# Patient Record
Sex: Female | Born: 1995 | Race: Black or African American | Hispanic: No | Marital: Single | Smoking: Never smoker
Health system: Southern US, Community
[De-identification: ages and names within clinical notes are randomized; demographics above are authoritative.]

## PROBLEM LIST (undated history)

## (undated) DIAGNOSIS — J45909 Unspecified asthma, uncomplicated: Secondary | ICD-10-CM

---

## 2017-09-14 ENCOUNTER — Emergency Department (HOSPITAL_BASED_OUTPATIENT_CLINIC_OR_DEPARTMENT_OTHER)
Admission: EM | Admit: 2017-09-14 | Discharge: 2017-09-14 | Disposition: A | Discharge status: Home / Self Care | Payer: Self-pay | Attending: Emergency Medicine | Admitting: Emergency Medicine

## 2017-09-14 ENCOUNTER — Other Ambulatory Visit: Payer: Self-pay

## 2017-09-14 ENCOUNTER — Encounter (HOSPITAL_BASED_OUTPATIENT_CLINIC_OR_DEPARTMENT_OTHER): Payer: Self-pay | Admitting: Emergency Medicine

## 2017-09-14 ENCOUNTER — Emergency Department (HOSPITAL_BASED_OUTPATIENT_CLINIC_OR_DEPARTMENT_OTHER): Payer: Self-pay

## 2017-09-14 DIAGNOSIS — Y929 Unspecified place or not applicable: Secondary | ICD-10-CM | POA: Insufficient documentation

## 2017-09-14 DIAGNOSIS — S63502A Unspecified sprain of left wrist, initial encounter: Secondary | ICD-10-CM | POA: Insufficient documentation

## 2017-09-14 DIAGNOSIS — Y999 Unspecified external cause status: Secondary | ICD-10-CM | POA: Insufficient documentation

## 2017-09-14 DIAGNOSIS — F17200 Nicotine dependence, unspecified, uncomplicated: Secondary | ICD-10-CM | POA: Insufficient documentation

## 2017-09-14 DIAGNOSIS — Y9389 Activity, other specified: Secondary | ICD-10-CM | POA: Insufficient documentation

## 2017-09-14 MED ORDER — HYDROCODONE-ACETAMINOPHEN 5-325 MG PO TABS
2.0000 | ORAL_TABLET | Freq: Once | ORAL | Status: DC
  Filled 2017-09-14: qty 2

## 2017-09-14 MED ORDER — ONDANSETRON 4 MG PO TBDP
4.0000 mg | ORAL_TABLET | Freq: Once | ORAL | Status: AC
  Administered 2017-09-14: 4 mg via ORAL
  Filled 2017-09-14: qty 1
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
## 2017-09-14 NOTE — Discharge Instructions (Signed)
You can take Tylenol or Ibuprofen as directed for pain. You can alternate Tylenol and Ibuprofen every 4 hours. If you take Tylenol at 1pm, then you can take Ibuprofen at 5pm. Then you can take Tylenol again at 9pm.   Follow the RICE (Rest, Ice, Compression, Elevation) protocol as directed.   Wear the splint for support and stabilization.  As we discussed, if your symptoms do not improve in the next 1-2 weeks, he can follow-up with outpatient orthopedic doctor.  Return to the emergency department for any worsening pain, discoloration of the fingers, numbness/weakness of the hands, fevers, redness or swelling of the hands or any other worsening or concerning symptoms.
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
## 2017-11-19 ENCOUNTER — Encounter (HOSPITAL_BASED_OUTPATIENT_CLINIC_OR_DEPARTMENT_OTHER): Payer: Self-pay

## 2017-11-19 ENCOUNTER — Other Ambulatory Visit: Payer: Self-pay

## 2017-11-19 ENCOUNTER — Emergency Department (HOSPITAL_BASED_OUTPATIENT_CLINIC_OR_DEPARTMENT_OTHER)
Admission: EM | Admit: 2017-11-19 | Discharge: 2017-11-19 | Disposition: A | Discharge status: Home / Self Care | Payer: 59 | Attending: Emergency Medicine | Admitting: Emergency Medicine

## 2017-11-19 DIAGNOSIS — K0889 Other specified disorders of teeth and supporting structures: Secondary | ICD-10-CM | POA: Insufficient documentation

## 2017-11-19 MED ORDER — IBUPROFEN 100 MG/5ML PO SUSP
800.0000 mg | Freq: Once | ORAL | Status: AC
  Administered 2017-11-19: 800 mg via ORAL

## 2017-11-19 MED ORDER — IBUPROFEN 100 MG/5ML PO SUSP: 800.0000 mg | Freq: Once | ORAL | Status: DC

## 2017-11-19 MED ORDER — AMOXICILLIN 400 MG/5ML PO SUSR
500.0000 mg | Freq: Three times a day (TID) | ORAL | 0 refills | Status: AC
Start: 2017-11-19 — End: 2017-11-26

## 2017-11-19 MED ORDER — HYDROCODONE-ACETAMINOPHEN 7.5-325 MG/15ML PO SOLN: 10.0000 mL | Freq: Four times a day (QID) | ORAL | 0 refills | Status: DC | PRN

## 2017-11-19 MED ORDER — OXYCODONE-ACETAMINOPHEN 5-325 MG PO TABS
1.0000 | ORAL_TABLET | Freq: Once | ORAL | Status: AC
  Administered 2017-11-19: 1 via ORAL

## 2017-11-19 MED ORDER — OXYCODONE-ACETAMINOPHEN 5-325 MG PO TABS
ORAL_TABLET | ORAL | Status: AC
  Filled 2017-11-19: qty 1

## 2017-11-19 MED ORDER — AMOXICILLIN 400 MG/5ML PO SUSR: 500.0000 mg | Freq: Three times a day (TID) | ORAL | 0 refills | Status: DC

## 2017-11-19 MED ORDER — IBUPROFEN 100 MG/5ML PO SUSP
ORAL | Status: AC
  Filled 2017-11-19: qty 40

## 2017-11-19 MED ORDER — BUPIVACAINE-EPINEPHRINE (PF) 0.5% -1:200000 IJ SOLN
INTRAMUSCULAR | Status: AC
  Filled 2017-11-19: qty 1.8

## 2017-11-19 MED ORDER — OXYCODONE-ACETAMINOPHEN 5-325 MG PO TABS: 1.0000 | ORAL_TABLET | ORAL | 0 refills | Status: DC | PRN

## 2017-11-19 MED ORDER — BUPIVACAINE-EPINEPHRINE (PF) 0.5% -1:200000 IJ SOLN
1.8000 mL | Freq: Once | INTRAMUSCULAR | Status: AC
  Administered 2017-11-19: 1.8 mL

## 2017-11-19 NOTE — Discharge Instructions (Signed)
You were seen in the ED today with dental pain. We attempted a dental block and provided pain medication. Call your dentist first thing tomorrow morning. Take the medication as prescribed and do not drive a car while taking Percocet. If you have any left over pills after your pain is gone you should throw these pills away. Return to the ED with any difficulty breathing, fever, chills, or difficulty swallowing.

## 2017-11-19 NOTE — ED Notes (Signed)
ED Provider at bedside. 

## 2017-11-19 NOTE — ED Provider Notes (Signed)
Emergency Department Provider Note   I have reviewed the triage vital signs and the nursing notes.   HISTORY  Chief Complaint Dental Pain   HPI Maureen Raymond is a 22 y.o. female presents to the emergency department for evaluation of severe, right-sided dental pain.  She has braces and is followed by dentistry.  When she began having pain recently she was started on antibiotics.  She states that her dentist told her that the filling is close to the nerve in that tooth and that she may need a root canal.  She has been referred to an oral surgeon but is waiting for antibiotics to he completed.  Today she has had worsening pain without fever.  No appreciable swelling.  If occult he swallowing.  History reviewed. No pertinent past medical history.  There are no active problems to display for this patient.   History reviewed. No pertinent surgical history.  Current Outpatient Rx  . Order #: [id number] Class: Historical Med  . Order #: [id number] Class: Print  . Order #: [id number] Class: Print    Allergies Patient has no known allergies.  No family history on file.  Social History Social History   Tobacco Use  . Smoking status: Current Every Day Smoker  . Smokeless tobacco: Never Used  Substance Use Topics  . Alcohol use: Yes    Comment: occ  . Drug use: Never    Review of Systems  Constitutional: No fever/chills Eyes: No visual changes. ENT: No sore throat. Severe right sided dental pain.  Cardiovascular: Denies chest pain. Respiratory: Denies shortness of breath. Gastrointestinal: No abdominal pain.  No nausea, no vomiting.  No diarrhea.  No constipation. Genitourinary: Negative for dysuria. Musculoskeletal: Negative for back pain. Skin: Negative for rash. Neurological: Negative for headaches, focal weakness or numbness.  10-point ROS otherwise negative.  ____________________________________________   PHYSICAL EXAM:  VITAL SIGNS: ED Triage Vitals  Enc  Vitals Group     BP 11/19/17 2107 136/90     Pulse Rate 11/19/17 2107 89     Resp 11/19/17 2107 (!) 24     Temp 11/19/17 2107 98.6 F (37 C)     Temp Source 11/19/17 2107 Oral     SpO2 11/19/17 2107 100 %     Weight 11/19/17 2106 175 lb 0.7 oz (79.4 kg)     Height 11/19/17 2106 5\' 5"  (1.651 m)     Pain Score 11/19/17 2105 10   Constitutional: Alert and oriented. Patient is tearful and holding her right face.  Eyes: Conjunctivae are normal.  Head: Atraumatic. Nose: No congestion/rhinnorhea. Mouth/Throat: Mucous membranes are moist.  Oropharynx non-erythematous. Braces in place. No visible abscess or dental fracture on the left mandibular molars. No PTA. Managing oral secretions and speaking in a clear voice.  Neck: No stridor.   Cardiovascular:  Good peripheral circulation.  Respiratory: Normal respiratory effort.   Musculoskeletal: No gross deformities of extremities. Neurologic: No gross focal neurologic deficits are appreciated.  Skin:  Skin is warm, dry and intact. No rash noted.  ____________________________________________   PROCEDURES  Procedure(s) performed:   Dental Block Date/Time: 11/19/2017 9:32 PM Performed by: Maia Plan, MD Authorized by: Maia Plan, MD   Consent:    Consent obtained:  Verbal   Consent given by:  Patient   Risks discussed:  Allergic reaction, hematoma, intravascular injection, infection, nerve damage, pain, unsuccessful block and swelling   Alternatives discussed:  No treatment Indications:    Indications: dental pain   Location:  Block type:  Inferior alveolar   Laterality:  Right Procedure details (see MAR for exact dosages):    Syringe type:  Aspirating dental syringe   Needle gauge:  25 G   Anesthetic injected:  Bupivacaine 0.5% w/o epi   Injection procedure:  Anatomic landmarks identified, anatomic landmarks palpated, introduced needle, negative aspiration for blood and incremental injection Post-procedure details:     Outcome:  Anesthesia achieved   Patient tolerance of procedure:  Tolerated well, no immediate complications     ____________________________________________   INITIAL IMPRESSION / ASSESSMENT AND PLAN / ED COURSE  Pertinent labs & imaging results that were available during my care of the patient were reviewed by me and considered in my medical decision making (see chart for details).  Patient presents to the emergency department for evaluation of continued dental pain.  Patient is tearful on my exam and appears very uncomfortable.  No visible abscess.  I performed a dental block in the inferior alveolar region with marcaine as described above. Patient pain resolved. No immediate complications. Wrote for liquid Amox and pain medication. Patient to call dentist for urgent appt in the AM.   At this time, I do not feel there is any life-threatening condition present. I have reviewed and discussed all exam findings with patient. I have reviewed nursing notes and appropriate previous records.  I feel the patient is safe to be discharged home without further emergent workup. Discussed usual and customary return precautions. Patient and family (if present) verbalize understanding and are comfortable with this plan.  Patient will follow-up with their primary care provider. If they do not have a primary care provider, information for follow-up has been provided to them. All questions have been answered.  ____________________________________________  FINAL CLINICAL IMPRESSION(S) / ED DIAGNOSES  Final diagnoses:  Pain, dental     MEDICATIONS GIVEN DURING THIS VISIT:  Medications  bupivacaine-epinephrine (MARCAINE W/ EPI) 0.5% -1:200000 injection 1.8 mL (1.8 mLs Infiltration Given by Other 11/19/17 2112)  oxyCODONE-acetaminophen (PERCOCET/ROXICET) 5-325 MG per tablet 1 tablet (1 tablet Oral Given 11/19/17 2131)  ibuprofen (ADVIL,MOTRIN) 100 MG/5ML suspension 800 mg (800 mg Oral Given 11/19/17 2158)      NEW OUTPATIENT MEDICATIONS STARTED DURING THIS VISIT:  Discharge Medication List as of 11/19/2017 10:00 PM    START taking these medications   Details  amoxicillin (AMOXIL) 400 MG/5ML suspension Take 6.3 mLs (500 mg total) by mouth 3 (three) times daily for 7 days., Starting Wed 11/19/2017, Until Wed 11/26/2017, Print    oxyCODONE-acetaminophen (PERCOCET/ROXICET) 5-325 MG tablet Take 1 tablet by mouth every 4 (four) hours as needed for severe pain., Starting Wed 11/19/2017, Print        Note:  This document was prepared using Dragon voice recognition software and may include unintentional dictation errors.  Alona BeneJoshua Sunny Gains Zeringue, MD Emergency Medicine    Hattie Aguinaldo, Arlyss RepressJoshua G, MD 11/20/17 1125

## 2017-11-19 NOTE — ED Triage Notes (Signed)
C/o right lower toothache x "few months"-entered triage steady gait/crying

## 2018-07-31 ENCOUNTER — Encounter (HOSPITAL_BASED_OUTPATIENT_CLINIC_OR_DEPARTMENT_OTHER): Payer: Self-pay | Admitting: Emergency Medicine

## 2018-07-31 ENCOUNTER — Emergency Department (HOSPITAL_BASED_OUTPATIENT_CLINIC_OR_DEPARTMENT_OTHER)
Admission: EM | Admit: 2018-07-31 | Discharge: 2018-07-31 | Disposition: A | Discharge status: Home / Self Care | Payer: 59 | Attending: Emergency Medicine | Admitting: Emergency Medicine

## 2018-07-31 ENCOUNTER — Other Ambulatory Visit: Payer: Self-pay

## 2018-07-31 DIAGNOSIS — F17200 Nicotine dependence, unspecified, uncomplicated: Secondary | ICD-10-CM | POA: Insufficient documentation

## 2018-07-31 DIAGNOSIS — K0889 Other specified disorders of teeth and supporting structures: Secondary | ICD-10-CM

## 2018-07-31 MED ORDER — AMOXICILLIN 250 MG/5ML PO SUSR
500.0000 mg | Freq: Once | ORAL | Status: AC
  Administered 2018-07-31: 500 mg via ORAL

## 2018-07-31 MED ORDER — AMOXICILLIN 400 MG/5ML PO SUSR: 1000.0000 mg | Freq: Two times a day (BID) | ORAL | 0 refills | Status: DC

## 2018-07-31 MED ORDER — HYDROCODONE-ACETAMINOPHEN 7.5-325 MG/15ML PO SOLN: 10.0000 mL | Freq: Four times a day (QID) | ORAL | 0 refills | Status: DC | PRN

## 2018-07-31 MED ORDER — AMOXICILLIN 250 MG/5ML PO SUSR
1000.0000 mg | Freq: Once | ORAL | Status: DC
  Filled 2018-07-31: qty 20

## 2018-07-31 NOTE — Discharge Instructions (Signed)
Take amoxicillin twice daily for 7 days.   Take motrin for pain   Take hycet for severe pain.   You need to see your dentist on Monday for follow up   Return to ER if you have worse pain or swelling, fever, trouble swallowing

## 2018-07-31 NOTE — ED Provider Notes (Signed)
[facility] EMERGENCY DEPARTMENT Provider Note   CSN: [id number] Arrival date & time: 07/31/18  1745    History   Chief Complaint Chief Complaint  Patient presents with  . Dental Pain    HPI Maureen Raymond is a 23 y.o. female here with dental pain.  Patient has dental pain for the last month or so.  Patient actually has a bracelets but was unable to see her dentist for the last month.  Patient noticed progressive pain in the left lower molar area and some swelling in the left side of her neck.  She denies any trouble swallowing or trouble breathing.  Denies any fevers or chills or drainage.      The history is provided by the patient.    History reviewed. No pertinent past medical history.  There are no active problems to display for this patient.   History reviewed. No pertinent surgical history.   OB History   No obstetric history on file.      Home Medications    Prior to Admission medications   Medication Sig Start Date End Date Taking? Authorizing Provider  amoxicillin (AMOXIL) 400 MG/5ML suspension Take 12.5 mLs (1,000 mg total) by mouth 2 (two) times daily. 07/31/18   Charlynne Pander, MD  HYDROcodone-acetaminophen (HYCET) 7.5-325 mg/15 ml solution Take 10 mLs by mouth every 6 (six) hours as needed. 07/31/18   Charlynne Pander, MD  ibuprofen (ADVIL,MOTRIN) 100 MG tablet Take 100 mg by mouth every 6 (six) hours as needed for fever.    [provider]    Family History History reviewed. No pertinent family history.  Social History Social History   Tobacco Use  . Smoking status: Current Every Day Smoker  . Smokeless tobacco: Never Used  Substance Use Topics  . Alcohol use: Yes    Comment: occ  . Drug use: Never     Allergies   Patient has no known allergies.   Review of Systems Review of Systems  HENT: Positive for dental problem.   All other systems reviewed and are negative.    Physical Exam Updated Vital Signs BP  128/90 (BP Location: Left Arm)   Pulse 80   Temp 98.1 F (36.7 C) (Oral)   Resp 20   Ht 5\' 5"  (1.651 m)   Wt 78.9 kg   LMP 07/31/2018 (Approximate)   SpO2 100%   BMI 28.96 kg/m   Physical Exam Vitals signs and nursing note reviewed.  Constitutional:      Appearance: Normal appearance.  HENT:     Head: Normocephalic.     Right Ear: Tympanic membrane normal.     Left Ear: Tympanic membrane normal.     Nose: Nose normal.     Mouth/Throat:     Mouth: Mucous membranes are moist.     Comments: Bracelets in place. L lower molar area with some surrounding swelling. Floor of mouth soft and nontender, posterior pharynx normal. No obvious periapical abscess Eyes:     Extraocular Movements: Extraocular movements intact.     Pupils: Pupils are equal, round, and reactive to light.  Neck:     Musculoskeletal: Normal range of motion.     Comments: Mild L cervical LAD  Cardiovascular:     Rate and Rhythm: Normal rate and regular rhythm.     Pulses: Normal pulses.     Heart sounds: Normal heart sounds.  Pulmonary:     Effort: Pulmonary effort is normal.     Breath  sounds: Normal breath sounds.  Abdominal:     General: Abdomen is flat.     Palpations: Abdomen is soft.  Musculoskeletal: Normal range of motion.  Skin:    General: Skin is warm.     Capillary Refill: Capillary refill takes less than 2 seconds.  Neurological:     General: No focal deficit present.     Mental Status: She is alert.  Psychiatric:        Mood and Affect: Mood normal.        Behavior: Behavior normal.      ED Treatments / Results  Labs (all labs ordered are listed, but only abnormal results are displayed) Labs Reviewed - No data to display  EKG None  Radiology No results found.  Procedures Procedures (including critical care time)  Medications Ordered in ED Medications  amoxicillin (AMOXIL) 250 MG/5ML suspension 500 mg (has no administration in time range)     Initial Impression /  Assessment and Plan / ED Course  I have reviewed the triage vital signs and the nursing notes.  Pertinent labs & imaging results that were available during my care of the patient were reviewed by me and considered in my medical decision making (see chart for details).       Maureen Raymond is a 23 y.o. female here with L dental pain. She has bracelets in place. Some swelling along the base of L lower molar, ? Early dental infection. No obvious periapical abscess, floor of mouth is soft. Will give amoxicillin, hycet. Told her to follow up with her orthodontist to adjust her bracelets and evaluate the swelling more. I checked the narcotics database and she has no recent narcotic prescriptions    Final Clinical Impressions(s) / ED Diagnoses   Final diagnoses:  Pain, dental    ED Discharge Orders         Ordered    amoxicillin (AMOXIL) 400 MG/5ML suspension  2 times daily     07/31/18 1801    HYDROcodone-acetaminophen (HYCET) 7.5-325 mg/15 ml solution  Every 6 hours PRN     07/31/18 1801           Charlynne Pander, MD 07/31/18 [phone]

## 2018-07-31 NOTE — ED Triage Notes (Signed)
Reports left lower dental pain which began 1 month ago. States worsening today.

## 2018-10-24 ENCOUNTER — Other Ambulatory Visit: Payer: Self-pay

## 2018-10-24 ENCOUNTER — Encounter (HOSPITAL_BASED_OUTPATIENT_CLINIC_OR_DEPARTMENT_OTHER): Payer: Self-pay | Admitting: Emergency Medicine

## 2018-10-24 ENCOUNTER — Observation Stay (HOSPITAL_BASED_OUTPATIENT_CLINIC_OR_DEPARTMENT_OTHER)
Admission: EM | Admit: 2018-10-24 | Discharge: 2018-10-27 | Disposition: A | Discharge status: Home / Self Care | Payer: 59 | Attending: Family Medicine | Admitting: Family Medicine

## 2018-10-24 DIAGNOSIS — Z79899 Other long term (current) drug therapy: Secondary | ICD-10-CM | POA: Diagnosis not present

## 2018-10-24 DIAGNOSIS — F17200 Nicotine dependence, unspecified, uncomplicated: Secondary | ICD-10-CM | POA: Diagnosis not present

## 2018-10-24 DIAGNOSIS — J4520 Mild intermittent asthma, uncomplicated: Secondary | ICD-10-CM | POA: Diagnosis not present

## 2018-10-24 DIAGNOSIS — F1290 Cannabis use, unspecified, uncomplicated: Secondary | ICD-10-CM | POA: Insufficient documentation

## 2018-10-24 DIAGNOSIS — E876 Hypokalemia: Secondary | ICD-10-CM | POA: Diagnosis not present

## 2018-10-24 DIAGNOSIS — Z8709 Personal history of other diseases of the respiratory system: Secondary | ICD-10-CM

## 2018-10-24 DIAGNOSIS — Z1159 Encounter for screening for other viral diseases: Secondary | ICD-10-CM | POA: Diagnosis not present

## 2018-10-24 DIAGNOSIS — F1210 Cannabis abuse, uncomplicated: Secondary | ICD-10-CM | POA: Diagnosis present

## 2018-10-24 DIAGNOSIS — Z720 Tobacco use: Secondary | ICD-10-CM | POA: Diagnosis present

## 2018-10-24 DIAGNOSIS — R112 Nausea with vomiting, unspecified: Secondary | ICD-10-CM | POA: Diagnosis present

## 2018-10-24 DIAGNOSIS — K219 Gastro-esophageal reflux disease without esophagitis: Secondary | ICD-10-CM | POA: Diagnosis not present

## 2018-10-24 DIAGNOSIS — R197 Diarrhea, unspecified: Secondary | ICD-10-CM | POA: Diagnosis not present

## 2018-10-24 DIAGNOSIS — Z791 Long term (current) use of non-steroidal anti-inflammatories (NSAID): Secondary | ICD-10-CM | POA: Diagnosis not present

## 2018-10-24 HISTORY — DX: Unspecified asthma, uncomplicated: J45.909

## 2018-10-24 LAB — CBC
HCT: 43.6 % (ref 36.0–46.0)
Hemoglobin: 14.5 g/dL (ref 12.0–15.0)
MCH: 28 pg (ref 26.0–34.0)
MCHC: 33.3 g/dL (ref 30.0–36.0)
MCV: 84.2 fL (ref 80.0–100.0)
Platelets: 196 10*3/uL (ref 150–400)
RBC: 5.18 MIL/uL — ABNORMAL HIGH (ref 3.87–5.11)
RDW: 13.6 % (ref 11.5–15.5)
WBC: 6.9 10*3/uL (ref 4.0–10.5)
nRBC: 0 % (ref 0.0–0.2)

## 2018-10-24 LAB — COMPREHENSIVE METABOLIC PANEL
ALT: 19 U/L (ref 0–44)
AST: 29 U/L (ref 15–41)
Albumin: 4.2 g/dL (ref 3.5–5.0)
Alkaline Phosphatase: 29 U/L — ABNORMAL LOW (ref 38–126)
Anion gap: 11 (ref 5–15)
BUN: 7 mg/dL (ref 6–20)
CO2: 22 mmol/L (ref 22–32)
Calcium: 9 mg/dL (ref 8.9–10.3)
Chloride: 104 mmol/L (ref 98–111)
Creatinine, Ser: 0.8 mg/dL (ref 0.44–1.00)
GFR calc Af Amer: >60 mL/min (ref >60)
GFR calc non Af Amer: >60 mL/min (ref >60)
Glucose, Bld: 122 mg/dL — ABNORMAL HIGH (ref 70–99)
Potassium: 3.2 mmol/L — ABNORMAL LOW (ref 3.5–5.1)
Sodium: 137 mmol/L (ref 135–145)
Total Bilirubin: 0.4 mg/dL (ref 0.3–1.2)
Total Protein: 7.8 g/dL (ref 6.5–8.1)

## 2018-10-24 LAB — LIPASE, BLOOD: Lipase: 28 U/L (ref 11–51)

## 2018-10-24 MED ORDER — ONDANSETRON HCL 4 MG/2ML IJ SOLN
4.0000 mg | Freq: Once | INTRAMUSCULAR | Status: AC | PRN
  Administered 2018-10-24: 4 mg via INTRAVENOUS
  Filled 2018-10-24: qty 2

## 2018-10-24 MED ORDER — LACTATED RINGERS IV BOLUS
1000.0000 mL | Freq: Once | INTRAVENOUS | Status: AC
  Administered 2018-10-24: 1000 mL via INTRAVENOUS

## 2018-10-24 NOTE — ED Notes (Signed)
Pt was asked if she could produce a urine sample and she said not at this time.

## 2018-10-24 NOTE — ED Provider Notes (Signed)
[facility] EMERGENCY DEPARTMENT Provider Note   CSN: [id number] Arrival date & time: 10/24/18  2136    History   Chief Complaint Chief Complaint  Patient presents with  . Diarrhea  . Emesis    HPI Maureen Raymond is a 23 y.o. female.     The history is provided by the patient. No language interpreter was used.  Diarrhea  Associated symptoms: vomiting   Emesis  Associated symptoms: diarrhea     Maureen Raymond is a 23 y.o. female who presents to the Emergency Department complaining of vomiting/diarrhea. She presents to the emergency department complaining of vomiting and diarrhea that began a few days ago. She initially developed numerous episodes of watery diarrhea 2 to 3 days ago followed by vomiting that began this morning around 4 AM. She has associated mild left lower quadrant abdominal pain. She denies any fevers, dysuria, vaginally discharge, cough. No known COVID exposures. No known sick contacts. She did eat some seafood that was left over on Saturday and questions if this possibly made her sick. She has a history of asthma, no additional medical problems. Symptoms are moderate to severe, constant, worsening.  History reviewed. No pertinent past medical history.  There are no active problems to display for this patient.   History reviewed. No pertinent surgical history.   OB History   No obstetric history on file.      Home Medications    Prior to Admission medications   Medication Sig Start Date End Date Taking? Authorizing Provider  amoxicillin (AMOXIL) 400 MG/5ML suspension Take 12.5 mLs (1,000 mg total) by mouth 2 (two) times daily. 07/31/18   Charlynne PanderYao, David Hsienta, MD  HYDROcodone-acetaminophen (HYCET) 7.5-325 mg/15 ml solution Take 10 mLs by mouth every 6 (six) hours as needed. 07/31/18   Charlynne PanderYao, David Hsienta, MD  ibuprofen (ADVIL,MOTRIN) 100 MG tablet Take 100 mg by mouth every 6 (six) hours as needed for fever.    [provider]     Family History No family history on file.  Social History Social History   Tobacco Use  . Smoking status: Current Every Day Smoker  . Smokeless tobacco: Never Used  Substance Use Topics  . Alcohol use: Yes    Comment: occ  . Drug use: Never     Allergies   Patient has no known allergies.   Review of Systems Review of Systems  Gastrointestinal: Positive for diarrhea and vomiting.  All other systems reviewed and are negative.    Physical Exam Updated Vital Signs BP 119/80   Pulse 65   Temp 98.2 F (36.8 C) (Oral)   Resp 20   Ht 5\' 5"  (1.651 m)   Wt 79.4 kg   LMP 10/24/2018   SpO2 99%   BMI 29.12 kg/m   Physical Exam Vitals signs and nursing note reviewed.  Constitutional:      Appearance: She is well-developed.  HENT:     Head: Normocephalic and atraumatic.  Cardiovascular:     Rate and Rhythm: Normal rate and regular rhythm.  Pulmonary:     Effort: Pulmonary effort is normal. No respiratory distress.  Abdominal:     Palpations: Abdomen is soft.     Tenderness: There is no guarding or rebound.     Comments: Mild generalized abdominal tenderness  Musculoskeletal:        General: No swelling or tenderness.  Skin:    General: Skin is warm and dry.  Neurological:     Mental Status:  She is alert and oriented to person, place, and time.  Psychiatric:        Behavior: Behavior normal.      ED Treatments / Results  Labs (all labs ordered are listed, but only abnormal results are displayed) Labs Reviewed  COMPREHENSIVE METABOLIC PANEL - Abnormal; Notable for the following components:      Result Value   Potassium 3.2 (*)    Glucose, Bld 122 (*)    Alkaline Phosphatase 29 (*)    All other components within normal limits  CBC - Abnormal; Notable for the following components:   RBC 5.18 (*)    All other components within normal limits  LIPASE, BLOOD  URINALYSIS, ROUTINE W REFLEX MICROSCOPIC  PREGNANCY, URINE    EKG None  Radiology No results  found.  Procedures Procedures (including critical care time)  Medications Ordered in ED Medications  ondansetron (ZOFRAN) injection 4 mg (4 mg Intravenous Given 10/24/18 2211)  lactated ringers bolus 1,000 mL (1,000 mLs Intravenous New Bag/Given 10/24/18 2302)     Initial Impression / Assessment and Plan / ED Course  I have reviewed the triage vital signs and the nursing notes.  Pertinent labs & imaging results that were available during my care of the patient were reviewed by me and considered in my medical decision making (see chart for details).        Patient here for evaluation of nausea, vomiting, diarrhea. She is dehydrated appearing on examination but non-toxic. She has mild abdominal tenderness without peritoneal findings. Labs demonstrate mild hypokalemia. Will treat with antiemetic, IV fluids. Patient care transferred pending urinalysis, IV fluids and reassessment. Current presentation is not consistent with acute appendicitis, diverticulitis, intra-abdominal abscess.  Final Clinical Impressions(s) / ED Diagnoses   Final diagnoses:  None    ED Discharge Orders    None       Tilden Fossa, MD 10/24/18 2310

## 2018-10-24 NOTE — ED Triage Notes (Signed)
Pt states she has been experiencing diarrhea for 3 days. Over 6 BMs today. Vomiting this 0400am. Denies fever.

## 2018-10-24 NOTE — ED Notes (Signed)
ED Provider at bedside. 

## 2018-10-25 ENCOUNTER — Encounter (HOSPITAL_COMMUNITY): Payer: Self-pay | Admitting: *Deleted

## 2018-10-25 ENCOUNTER — Other Ambulatory Visit: Payer: Self-pay

## 2018-10-25 ENCOUNTER — Emergency Department (HOSPITAL_BASED_OUTPATIENT_CLINIC_OR_DEPARTMENT_OTHER): Payer: 59

## 2018-10-25 DIAGNOSIS — Z1159 Encounter for screening for other viral diseases: Secondary | ICD-10-CM | POA: Diagnosis not present

## 2018-10-25 DIAGNOSIS — E876 Hypokalemia: Secondary | ICD-10-CM | POA: Diagnosis not present

## 2018-10-25 DIAGNOSIS — F1290 Cannabis use, unspecified, uncomplicated: Secondary | ICD-10-CM | POA: Diagnosis not present

## 2018-10-25 DIAGNOSIS — F17200 Nicotine dependence, unspecified, uncomplicated: Secondary | ICD-10-CM | POA: Diagnosis not present

## 2018-10-25 DIAGNOSIS — Z79899 Other long term (current) drug therapy: Secondary | ICD-10-CM | POA: Diagnosis not present

## 2018-10-25 DIAGNOSIS — J4520 Mild intermittent asthma, uncomplicated: Secondary | ICD-10-CM | POA: Diagnosis not present

## 2018-10-25 DIAGNOSIS — N390 Urinary tract infection, site not specified: Secondary | ICD-10-CM | POA: Diagnosis not present

## 2018-10-25 DIAGNOSIS — R112 Nausea with vomiting, unspecified: Secondary | ICD-10-CM | POA: Diagnosis present

## 2018-10-25 DIAGNOSIS — F1210 Cannabis abuse, uncomplicated: Secondary | ICD-10-CM | POA: Diagnosis present

## 2018-10-25 DIAGNOSIS — K219 Gastro-esophageal reflux disease without esophagitis: Secondary | ICD-10-CM | POA: Diagnosis present

## 2018-10-25 DIAGNOSIS — Z791 Long term (current) use of non-steroidal anti-inflammatories (NSAID): Secondary | ICD-10-CM | POA: Diagnosis not present

## 2018-10-25 DIAGNOSIS — Z8709 Personal history of other diseases of the respiratory system: Secondary | ICD-10-CM | POA: Diagnosis not present

## 2018-10-25 DIAGNOSIS — Z720 Tobacco use: Secondary | ICD-10-CM | POA: Diagnosis present

## 2018-10-25 DIAGNOSIS — R197 Diarrhea, unspecified: Secondary | ICD-10-CM | POA: Diagnosis not present

## 2018-10-25 LAB — URINALYSIS, ROUTINE W REFLEX MICROSCOPIC
Bilirubin Urine: NEGATIVE
Glucose, UA: NEGATIVE mg/dL
Ketones, ur: >80 mg/dL — AB
Leukocytes,Ua: NEGATIVE
Nitrite: NEGATIVE
Protein, ur: NEGATIVE mg/dL
Specific Gravity, Urine: 1.02 (ref 1.005–1.030)
pH: 8.5 — ABNORMAL HIGH (ref 5.0–8.0)

## 2018-10-25 LAB — URINALYSIS, MICROSCOPIC (REFLEX): RBC / HPF: >50 RBC/hpf (ref 0–5)

## 2018-10-25 LAB — MAGNESIUM: Magnesium: 1.8 mg/dL (ref 1.7–2.4)

## 2018-10-25 LAB — RAPID URINE DRUG SCREEN, HOSP PERFORMED
Amphetamines: NOT DETECTED
Barbiturates: NOT DETECTED
Benzodiazepines: NOT DETECTED
Cocaine: NOT DETECTED
Opiates: NOT DETECTED
Tetrahydrocannabinol: POSITIVE — AB

## 2018-10-25 LAB — SARS CORONAVIRUS 2 BY RT PCR (HOSPITAL ORDER, PERFORMED IN ~~LOC~~ HOSPITAL LAB): SARS Coronavirus 2: NEGATIVE

## 2018-10-25 LAB — PREGNANCY, URINE: Preg Test, Ur: NEGATIVE

## 2018-10-25 MED ORDER — IOHEXOL 300 MG/ML  SOLN
100.0000 mL | Freq: Once | INTRAMUSCULAR | Status: AC
  Administered 2018-10-25: 100 mL via INTRAVENOUS

## 2018-10-25 MED ORDER — SODIUM CHLORIDE 0.9 % IV SOLN
INTRAVENOUS | Status: DC | PRN
  Administered 2018-10-25: 04:00:00 via INTRAVENOUS

## 2018-10-25 MED ORDER — SODIUM CHLORIDE 0.9% FLUSH: 3.0000 mL | Freq: Two times a day (BID) | INTRAVENOUS | Status: DC

## 2018-10-25 MED ORDER — FAMOTIDINE IN NACL 20-0.9 MG/50ML-% IV SOLN
20.0000 mg | Freq: Two times a day (BID) | INTRAVENOUS | Status: DC
  Administered 2018-10-25 – 2018-10-26 (×3): 20 mg via INTRAVENOUS
  Filled 2018-10-25 (×3): qty 50

## 2018-10-25 MED ORDER — POTASSIUM CHLORIDE 10 MEQ/100ML IV SOLN
10.0000 meq | INTRAVENOUS | Status: AC
  Administered 2018-10-25 (×3): 10 meq via INTRAVENOUS
  Filled 2018-10-25 (×2): qty 100

## 2018-10-25 MED ORDER — ENOXAPARIN SODIUM 40 MG/0.4ML ~~LOC~~ SOLN
40.0000 mg | SUBCUTANEOUS | Status: DC
  Administered 2018-10-25: 40 mg via SUBCUTANEOUS
  Filled 2018-10-25 (×2): qty 0.4

## 2018-10-25 MED ORDER — ONDANSETRON 8 MG PO TBDP: ORAL_TABLET | ORAL | 0 refills | Status: DC

## 2018-10-25 MED ORDER — SODIUM CHLORIDE 0.9 % IV BOLUS
1000.0000 mL | Freq: Once | INTRAVENOUS | Status: AC
  Administered 2018-10-25: 1000 mL via INTRAVENOUS

## 2018-10-25 MED ORDER — ONDANSETRON HCL 4 MG/2ML IJ SOLN
4.0000 mg | Freq: Three times a day (TID) | INTRAMUSCULAR | Status: DC | PRN
  Filled 2018-10-25 (×3): qty 2

## 2018-10-25 MED ORDER — POTASSIUM CHLORIDE CRYS ER 20 MEQ PO TBCR: 40.0000 meq | EXTENDED_RELEASE_TABLET | Freq: Once | ORAL | Status: DC

## 2018-10-25 MED ORDER — ONDANSETRON 8 MG PO TBDP
8.0000 mg | ORAL_TABLET | Freq: Once | ORAL | Status: AC
  Administered 2018-10-25: 03:00:00 8 mg via ORAL

## 2018-10-25 MED ORDER — ONDANSETRON HCL 4 MG PO TABS: 4.0000 mg | ORAL_TABLET | Freq: Four times a day (QID) | ORAL | Status: DC | PRN

## 2018-10-25 MED ORDER — ONDANSETRON HCL 4 MG/2ML IJ SOLN
4.0000 mg | Freq: Four times a day (QID) | INTRAMUSCULAR | Status: DC | PRN
  Administered 2018-10-25 – 2018-10-26 (×4): 4 mg via INTRAVENOUS
  Filled 2018-10-25: qty 2

## 2018-10-25 MED ORDER — METOCLOPRAMIDE HCL 5 MG/ML IJ SOLN
5.0000 mg | Freq: Three times a day (TID) | INTRAMUSCULAR | Status: DC
  Administered 2018-10-25 – 2018-10-26 (×3): 5 mg via INTRAVENOUS
  Filled 2018-10-25 (×3): qty 2

## 2018-10-25 MED ORDER — SODIUM CHLORIDE 0.9 % IV SOLN
INTRAVENOUS | Status: DC
  Administered 2018-10-25: 08:00:00 via INTRAVENOUS

## 2018-10-25 MED ORDER — ONDANSETRON HCL 4 MG/2ML IJ SOLN
4.0000 mg | Freq: Once | INTRAMUSCULAR | Status: AC
  Administered 2018-10-25: 4 mg via INTRAVENOUS
  Filled 2018-10-25: qty 2

## 2018-10-25 MED ORDER — SODIUM CHLORIDE 0.9 % IV SOLN
INTRAVENOUS | Status: DC
  Administered 2018-10-25 – 2018-10-26 (×4): via INTRAVENOUS

## 2018-10-25 MED ORDER — KETOROLAC TROMETHAMINE 30 MG/ML IJ SOLN
30.0000 mg | Freq: Once | INTRAMUSCULAR | Status: AC
  Administered 2018-10-25: 30 mg via INTRAVENOUS

## 2018-10-25 MED ORDER — SODIUM CHLORIDE 0.9 % IV SOLN
1.0000 g | INTRAVENOUS | Status: DC
  Administered 2018-10-25 – 2018-10-26 (×2): 1 g via INTRAVENOUS
  Filled 2018-10-25 (×2): qty 10

## 2018-10-25 MED ORDER — MORPHINE SULFATE (PF) 2 MG/ML IV SOLN
2.0000 mg | INTRAVENOUS | Status: DC | PRN
  Administered 2018-10-25 – 2018-10-26 (×3): 2 mg via INTRAVENOUS
  Filled 2018-10-25 (×3): qty 1

## 2018-10-25 MED ORDER — POTASSIUM CHLORIDE 10 MEQ/100ML IV SOLN
10.0000 meq | INTRAVENOUS | Status: AC
  Administered 2018-10-25 (×2): 10 meq via INTRAVENOUS
  Filled 2018-10-25 (×2): qty 100

## 2018-10-25 MED ORDER — ALBUTEROL SULFATE (2.5 MG/3ML) 0.083% IN NEBU: 2.5000 mg | INHALATION_SOLUTION | Freq: Four times a day (QID) | RESPIRATORY_TRACT | Status: DC | PRN

## 2018-10-25 MED ORDER — KETOROLAC TROMETHAMINE 30 MG/ML IJ SOLN
INTRAMUSCULAR | Status: AC
  Administered 2018-10-25: 30 mg via INTRAVENOUS
  Filled 2018-10-25: qty 1

## 2018-10-25 MED ORDER — PROMETHAZINE HCL 25 MG/ML IJ SOLN
12.5000 mg | Freq: Once | INTRAMUSCULAR | Status: AC
  Administered 2018-10-25: 12.5 mg via INTRAVENOUS
  Filled 2018-10-25: qty 1

## 2018-10-25 MED ORDER — ONDANSETRON 8 MG PO TBDP
ORAL_TABLET | ORAL | Status: AC
  Filled 2018-10-25: qty 1

## 2018-10-25 MED ORDER — CAPSAICIN 0.025 % EX CREA
TOPICAL_CREAM | Freq: Once | CUTANEOUS | Status: AC
  Administered 2018-10-25: 02:00:00 via TOPICAL
  Filled 2018-10-25 (×2): qty 60

## 2018-10-25 MED ORDER — FAMOTIDINE IN NACL 20-0.9 MG/50ML-% IV SOLN
20.0000 mg | Freq: Once | INTRAVENOUS | Status: AC
  Administered 2018-10-25: 20 mg via INTRAVENOUS
  Filled 2018-10-25: qty 50

## 2018-10-25 NOTE — ED Notes (Signed)
ED Provider at bedside. 

## 2018-10-25 NOTE — ED Notes (Signed)
Pt vomited after MD assessment. Dr. Judd Lien notified. See orders

## 2018-10-25 NOTE — ED Notes (Signed)
RN asked if patient would like RN to update family. She stated she would like for RN to call mother with update. RN called mother and gave her an update that patient is to be admitted and that we would call her with an updated room and phone number since she is not allowed to visit currently.

## 2018-10-25 NOTE — ED Notes (Signed)
Called [facility] - s/w Kim - advised that a bed was ready @ [facility] (5M15C).  Kim advised they would send a truck over

## 2018-10-25 NOTE — ED Provider Notes (Addendum)
Care assumed from Dr. Madilyn Hook at shift change.  Patient presented here with complaints of upset stomach, loose stools, and nausea and vomiting.  This began earlier this week, however worsened significantly the last 2 days.  She has had difficulty keeping anything down.  Her work-up thus far is unremarkable including laboratory studies, urinalysis, and pregnancy test.  Given multiple doses of antiemetics and fluids, however continues to vomit.  Patient then underwent CT scan which showed no acute intra-abdominal process.  Upon further questioning, patient does admit to daily marijuana use.  I am highly suspicious that this presentation and illness is related to a cannabis hyperemesis syndrome.  Capsaicin cream was then applied to the patient's abdomen which did result in the patient feeling somewhat better.  At this point, patient will be discharged and is to return as needed.  She will be prescribed Zofran and was also recommended to curtail her marijuana use.  Addendum: As the patient was being discharged, she began vomiting again.  Patient states that she is now feeling worse than when she came in.  At this point, I do not feel as though the patient can be discharged.  She will be admitted for hydration, antiemetics, and observation.  Dr. Clyde Lundborg agrees to admit.   Geoffery Lyons, MD 10/25/18 0017    Geoffery Lyons, MD 10/25/18 4944

## 2018-10-25 NOTE — ED Notes (Signed)
Mother updated on patients condition per patient request.

## 2018-10-25 NOTE — ED Notes (Signed)
[facility] called and report was given.  

## 2018-10-25 NOTE — ED Notes (Signed)
Pt understood dc material. NAD noted. Script and work excuse given at [employer]. All questions answered to satisfaction.

## 2018-10-25 NOTE — ED Notes (Signed)
Patient transported to CT 

## 2018-10-25 NOTE — H&P (Addendum)
History and Physical    Maureen Raymond ZOX:[id number] DOB: 07-24-1995 DOA: 10/24/2018  Referring MD/NP/PA: Lorretta Harp, MD PCP: System, Pcp Not In  Patient coming from: Home  Chief Complaint: Nausea, vomiting, and diarrhea  I have personally briefly reviewed patient's old medical records in [facility]   HPI: Maureen Raymond is a 23 y.o. female with medical history significant of mild intermittent asthma and acid reflux; who presents with complaints of nausea, vomiting, and diarrhea over the last week.  Reports that she had seafood on Friday and ate leftovers on Saturday.  On Monday, she started having nonbloody diarrhea with up to 6 bowel movements per day.  Nausea and vomiting symptoms started yesterday morning around 4 AM.  Emesis reported to just be of stomach contents.  She has been unable to keep any significant food or liquids down.  Associated symptoms include malaise and left sided abdominal pain.  Nothing relieves symptoms.  She admits to smoking marijuana daily.  2-3 months ago she had a dental infection.  For at least 2 weeks or so had been taking ibuprofen regularly until she had be root canal approximately 1 month ago. Does make note of childhood history of ulcers, but unable to give any additional information.  Takes over-the-counter antacid medications for reflux.  Denies having any fever, dysuria, urinary frequency, or flank pain.  Patient reports that she just recently started her menstrual cycle.  ED Course: Upon admission into the emergency department patient was noted to be afebrile with heart rates 51-69, and all other vital signs within normal limits.  Significant labs included WBC 6.9, potassium 3.2, and lipase 28.  Urine pregnancy screen was negative.  Urinalysis was positive for hematuria and many bacteria.  Although urinalysis was negative for nitrites, leukocytes, and WBCs.  UDS was positive for marijuana.  CT scan of the abdomen and pelvis did not show any acute  abnormalities.  She received at least 1 L of IV fluids, Pepcid IV, several doses of Zofran, Phenergan, 30 mg of ketorolac IV, and capsaicin placed to her abdomen.  Patient was accepted to a MedSurg bed here at [facility].  COVID-19 testing was negative.   Review of Systems  Constitutional: Positive for malaise/fatigue. Negative for fever.  HENT: Negative for ear discharge and tinnitus.   Eyes: Negative for pain and discharge.  Respiratory: Negative for cough, shortness of breath and wheezing.   Cardiovascular: Negative for palpitations, leg swelling and PND.  Gastrointestinal: Positive for abdominal pain, diarrhea, heartburn, nausea and vomiting.  Genitourinary: Negative for dysuria, flank pain and frequency.  Musculoskeletal: Negative for joint pain and myalgias.  Skin: Negative for itching and rash.  Neurological: Positive for tingling. Negative for tremors, focal weakness and loss of consciousness.  Endo/Heme/Allergies: Negative for environmental allergies. Does not bruise/bleed easily.  Psychiatric/Behavioral: Positive for substance abuse. Negative for memory loss.    Past Medical History:  Diagnosis Date   Asthma     History reviewed. No pertinent surgical history.   reports that she has never smoked. She has never used smokeless tobacco. She reports current alcohol use. She reports current drug use. Frequency: 7.00 times per week. Drug: Marijuana.  No Known Allergies  Family History  Problem Relation Age of Onset   Asthma Brother     Prior to Admission medications   Medication Sig Start Date End Date Taking? Authorizing Provider  amoxicillin (AMOXIL) 400 MG/5ML suspension Take 12.5 mLs (1,000 mg total) by mouth 2 (two) times daily. 07/31/18  Charlynne PanderYao, David Hsienta, MD  HYDROcodone-acetaminophen (HYCET) 7.5-325 mg/15 ml solution Take 10 mLs by mouth every 6 (six) hours as needed. 07/31/18   Charlynne PanderYao, David Hsienta, MD  ibuprofen (ADVIL,MOTRIN) 100 MG tablet Take 100 mg by mouth  every 6 (six) hours as needed for fever.    [provider]  ondansetron (ZOFRAN ODT) 8 MG disintegrating tablet 8mg  ODT q4 hours prn nausea 10/25/18   Geoffery Lyonselo, Douglas, MD    Physical Exam:  Constitutional: NAD, calm, comfortable Vitals:   10/25/18 0300 10/25/18 0406 10/25/18 0419 10/25/18 0610  BP:  123/70  130/75  Pulse: 62  69 (!) 51  Resp:    18  Temp:    98 F (36.7 C)  TempSrc:    Oral  SpO2: 99% 99% 100% 100%  Weight:    78.8 kg  Height:    5\' 5"  (1.651 m)   Eyes: PERRL, lids and conjunctivae normal ENMT: Mucous membranes are moist. Posterior pharynx clear of any exudate or lesions.Normal dentition.  Neck: normal, supple, no masses, no thyromegaly Respiratory: clear to auscultation bilaterally, no wheezing, no crackles. Normal respiratory effort. No accessory muscle use.  Cardiovascular: Regular rate and rhythm, no murmurs / rubs / gallops. No extremity edema. 2+ pedal pulses. No carotid bruits.  Abdomen: no tenderness, no masses palpated. No hepatosplenomegaly. Bowel sounds positive.  Musculoskeletal: no clubbing / cyanosis. No joint deformity upper and lower extremities. Good ROM, no contractures. Normal muscle tone.  Skin: no rashes, lesions, ulcers. No induration Neurologic: CN 2-12 grossly intact. Sensation intact, DTR normal. Strength 5/5 in all 4.  Psychiatric: Normal judgment and insight. Alert and oriented x 3. Normal mood.     Labs on Admission: I have personally reviewed following labs and imaging studies  CBC: Recent Labs  Lab 10/24/18 2205  WBC 6.9  HGB 14.5  HCT 43.6  MCV 84.2  PLT 196   Basic Metabolic Panel: Recent Labs  Lab 10/24/18 2205  NA 137  K 3.2*  CL 104  CO2 22  GLUCOSE 122*  BUN 7  CREATININE 0.80  CALCIUM 9.0   GFR: Estimated Creatinine Clearance: 112.5 mL/min (by C-G formula based on SCr of 0.8 mg/dL). Liver Function Tests: Recent Labs  Lab 10/24/18 2205  AST 29  ALT 19  ALKPHOS 29*  BILITOT 0.4  PROT 7.8    ALBUMIN 4.2   Recent Labs  Lab 10/24/18 2205  LIPASE 28   No results for input(s): AMMONIA in the last 168 hours. Coagulation Profile: No results for input(s): INR, PROTIME in the last 168 hours. Cardiac Enzymes: No results for input(s): CKTOTAL, CKMB, CKMBINDEX, TROPONINI in the last 168 hours. BNP (last 3 results) No results for input(s): PROBNP in the last 8760 hours. HbA1C: No results for input(s): HGBA1C in the last 72 hours. CBG: No results for input(s): GLUCAP in the last 168 hours. Lipid Profile: No results for input(s): CHOL, HDL, LDLCALC, TRIG, CHOLHDL, LDLDIRECT in the last 72 hours. Thyroid Function Tests: No results for input(s): TSH, T4TOTAL, FREET4, T3FREE, THYROIDAB in the last 72 hours. Anemia Panel: No results for input(s): VITAMINB12, FOLATE, FERRITIN, TIBC, IRON, RETICCTPCT in the last 72 hours. Urine analysis:    Component Value Date/Time   COLORURINE YELLOW 10/25/2018 0013   APPEARANCEUR HAZY (A) 10/25/2018 0013   LABSPEC 1.020 10/25/2018 0013   PHURINE 8.5 (H) 10/25/2018 0013   GLUCOSEU NEGATIVE 10/25/2018 0013   HGBUR LARGE (A) 10/25/2018 0013   BILIRUBINUR NEGATIVE 10/25/2018 0013   KETONESUR >  80 (A) 10/25/2018 0013   PROTEINUR NEGATIVE 10/25/2018 0013   NITRITE NEGATIVE 10/25/2018 0013   LEUKOCYTESUR NEGATIVE 10/25/2018 0013   Sepsis Labs: No results found for this or any previous visit (from the past 240 hour(s)).   Radiological Exams on Admission: Ct Abdomen Pelvis W Contrast  Result Date: 10/25/2018 CLINICAL DATA:  Abdominal pain EXAM: CT ABDOMEN AND PELVIS WITH CONTRAST TECHNIQUE: Multidetector CT imaging of the abdomen and pelvis was performed using the standard protocol following bolus administration of intravenous contrast. CONTRAST:  OMNIPAQUE IOHEXOL 300 MG/ML  SOLN COMPARISON:  None. FINDINGS: LOWER CHEST: There is no basilar pleural or apical pericardial effusion. HEPATOBILIARY: The hepatic contours and density are normal.  There is no intra- or extrahepatic biliary dilatation. The gallbladder is normal. PANCREAS: The pancreatic parenchymal contours are normal and there is no ductal dilatation. There is no peripancreatic fluid collection. SPLEEN: Normal. ADRENALS/URINARY TRACT: --Adrenal glands: Normal. --Right kidney/ureter: No hydronephrosis, nephroureterolithiasis, perinephric stranding or solid renal mass. --Left kidney/ureter: No hydronephrosis, nephroureterolithiasis, perinephric stranding or solid renal mass. --Urinary bladder: Normal for degree of distention STOMACH/BOWEL: --Stomach/Duodenum: There is no hiatal hernia or other gastric abnormality. The duodenal course and caliber are normal. --Small bowel: No dilatation or inflammation. --Colon: No focal abnormality. --Appendix: Not visualized. No right lower quadrant inflammation or free fluid. VASCULAR/LYMPHATIC: Normal course and caliber of the major abdominal vessels. No abdominal or pelvic lymphadenopathy. REPRODUCTIVE: Normal uterus and ovaries. MUSCULOSKELETAL. No bony spinal canal stenosis or focal osseous abnormality. OTHER: None. IMPRESSION: No acute abnormality of the abdomen or pelvis. Electronically Signed   By: Deatra Robinson M.D.   On: 10/25/2018 02:06    EKG: Independently reviewed.  Sinus rhythm at 60 bpm QTc 430  Assessment/Plan Nausea, vomiting, and diarrhea: Patient presents with symptoms over the last several days.  Diarrhea symptoms have reportedly stopped.  Ketones in urine are consistent with history.  Urine drug screen was positive for marijuana.  Urine pregnancy screen was negative along with CT abdomen/pelvis.  Differential diagnosis includes gastroenteritis related with seafood/PUD/cannabis hyperemesis syndrome/UTI. Capsaicin cream was applied to stomach as noted to be a possible treatment for cannabis hyperemesis syndrome. -Admit to a MedSurg bed -Monitor intake and output -Normal saline IV fluids at 100 mL/h -Antiemetics as  needed -Schedule Reglan 5 mg IV every 8 hours -Advance diet as tolerated  -Hold NSAIDs -May warrant further investigation or studies if does persist  Abdominal pain: Noted to be on the left side of her abdomen.  Possibly related to gastroenteritis, ulcer, or related with nausea and vomiting symptoms. -Morphine IV as needed for pain  Possible UTI: Acute.  Patient is on her menstrual cycle.  Urinalysis was positive for large hemoglobin, with many bacteria, and >50 RBCs. -Check urine culture   Hypokalemia: Admission potassium noted to be 3.2.  Patient was initially ordered 40 mEq potassium chloride p.o. -Discontinued p.o. ordered -Give 30 mEq IV due to nausea and vomiting -Recheck potassium in a.m. and replace as needed  Marijuana use: Patient reports smoking marijuana daily. -Counseled on the need to refrain from use of cannabis  GERD -Pepcid IV   History of asthma: Appears well controlled and patient only uses inhaler occasionally  DVT prophylaxis: lovenox   Code Status: Full Family Communication: No family present at bedside Disposition Plan: Home in a.m. if symptoms resolved and tolerating diet Consults called: None Admission status: Observation  Clydie Braun MD [facility] Pager [phone]   If 7PM-7AM, please contact night-coverage [email or web address] Password [email or web address]  10/25/2018, 7:16 AM

## 2018-10-25 NOTE — ED Notes (Signed)
Pt still vomiting K order placed on hold until patient can tolerate PO

## 2018-10-25 NOTE — ED Notes (Signed)
Confirmed with CN that [employer] Rapid Covid testing is unavailable at current time. Synetta Fail, RN at [facility] on 31M notified of this and informed that they would have to perform their rapid covid test. EDP aware

## 2018-10-25 NOTE — Care Management (Signed)
This is a no charge note  Transfer from [name] per Dr. Judd Lien,  23 year old lady with past medical history of tobacco abuse, marijuana abuse, who presents with intractable nausea, vomiting. Also has diarrhea.  Patient has mild abdominal pain.  Symptoms has been going on for several days.  Patient cannot keep anything down.  Patient was found to have WBC 6.9, lipase of 28, negative pregnancy test, urinalysis not impressive, potassium of 3.2, renal function normal, temperature normal, no tachycardia, oxygen saturation 99% on room air.  CT abdomen/pelvis is negative for acute abnormalities.  Patient is placed on MedSurg bed for observation.  Will check COVID-19 for screening purpose.    Please call manager of [facility] at [phone] when pt arrives to floor   Lorretta Harp, MD  [facility]   If 7PM-7AM, please contact night-coverage [email or web address] Password [email or web address] 10/25/2018, 4:00 AM

## 2018-10-25 NOTE — ED Notes (Signed)
RN went into room with EDP and patient stated that she was feeling some better but would like to go home. EDP agreed.

## 2018-10-26 DIAGNOSIS — K219 Gastro-esophageal reflux disease without esophagitis: Secondary | ICD-10-CM | POA: Diagnosis not present

## 2018-10-26 DIAGNOSIS — R112 Nausea with vomiting, unspecified: Secondary | ICD-10-CM | POA: Diagnosis not present

## 2018-10-26 DIAGNOSIS — E876 Hypokalemia: Secondary | ICD-10-CM | POA: Diagnosis not present

## 2018-10-26 DIAGNOSIS — Z720 Tobacco use: Secondary | ICD-10-CM | POA: Diagnosis not present

## 2018-10-26 LAB — BASIC METABOLIC PANEL
Anion gap: 8 (ref 5–15)
BUN: <5 mg/dL — ABNORMAL LOW (ref 6–20)
CO2: 22 mmol/L (ref 22–32)
Calcium: 7.7 mg/dL — ABNORMAL LOW (ref 8.9–10.3)
Chloride: 108 mmol/L (ref 98–111)
Creatinine, Ser: 0.76 mg/dL (ref 0.44–1.00)
GFR calc Af Amer: >60 mL/min (ref >60)
GFR calc non Af Amer: >60 mL/min (ref >60)
Glucose, Bld: 86 mg/dL (ref 70–99)
Potassium: 3.2 mmol/L — ABNORMAL LOW (ref 3.5–5.1)
Sodium: 138 mmol/L (ref 135–145)

## 2018-10-26 LAB — CBC
HCT: 36.2 % (ref 36.0–46.0)
Hemoglobin: 12.1 g/dL (ref 12.0–15.0)
MCH: 28.1 pg (ref 26.0–34.0)
MCHC: 33.4 g/dL (ref 30.0–36.0)
MCV: 84.2 fL (ref 80.0–100.0)
Platelets: 179 10*3/uL (ref 150–400)
RBC: 4.3 MIL/uL (ref 3.87–5.11)
RDW: 13.8 % (ref 11.5–15.5)
WBC: 5.8 10*3/uL (ref 4.0–10.5)
nRBC: 0 % (ref 0.0–0.2)

## 2018-10-26 LAB — HIV ANTIBODY (ROUTINE TESTING W REFLEX): HIV Screen 4th Generation wRfx: NONREACTIVE

## 2018-10-26 LAB — URINE CULTURE

## 2018-10-26 MED ORDER — POTASSIUM CHLORIDE CRYS ER 20 MEQ PO TBCR
40.0000 meq | EXTENDED_RELEASE_TABLET | Freq: Once | ORAL | Status: AC
  Administered 2018-10-26: 40 meq via ORAL
  Filled 2018-10-26: qty 2

## 2018-10-26 NOTE — Progress Notes (Signed)
PROGRESS NOTE    Maureen Raymond  WUJ:[id number] DOB: 30-Jun-1995 DOA: 10/24/2018 PCP: System, Pcp Not In   Brief Narrative: Maureen Raymond is a 23 y.o. female with medical history significant of mild intermittent asthma and acid reflux. She presented secondary to nausea/vomiting/diarrhea that has remained persistent.   Assessment & Plan:   Principal Problem:   Nausea vomiting and diarrhea Active Problems:   Tobacco abuse   Hypokalemia   Acute lower UTI   GERD (gastroesophageal reflux disease)   History of asthma   Nausea/vomiting/diarrhea Presumed infectious. Diarrhea resolved. Afebrile. Small formed stool overnight. Still with significant nausea prohibiting ability to take oral nutrition today. Patient also uses marijuana chronically and stopped using with diarrheal symptoms one week ago. Vomiting started three days ago. Possibly hyperemesis cannabinoid syndrome could be playing a role. History of multiple sex partners (female and female) with sex w/o barrier protection. No vaginal discharge or pelvic tenderness; CT scan without suggestion of uterus inflammation. -Continue IV fluids while unable to take by mouth -Discontinue Reglan; continue Zofran IV  Abdominal pain Left sided, possibly secondary to vomiting previously. CT scan was unremarkable.  GERD -Continue Pepcid  UTI Asymptomatic but concern for abnormal urinalysis. Urine culture with multiple species. -Discontinue ceftriaxone   DVT prophylaxis: Lovenox Code Status:   Code Status: Full Code Family Communication: None Disposition Plan: Discharge likely in 24 hours if able to start tolerating an oral diet   Consultants:   None  Procedures:   None  Antimicrobials:  Ceftriaxone (5/17>>    Subjective: Still with nausea and left abdominal pain.  Objective: Vitals:   10/25/18 1629 10/25/18 2101 10/26/18 0512 10/26/18 0920  BP: 110/67 112/67 (!) 113/52 (!) 142/90  Pulse: 74 (!) 52 (!) 53 (!) 51  Resp:  Temp: 98.4 F (36.9 C) 98.2 F (36.8 C) 98.2 F (36.8 C) 98.3 F (36.8 C)  TempSrc: Oral  Oral Oral  SpO2: 98% 97% 99% 100%  Weight:      Height:        Intake/Output Summary (Last 24 hours) at 10/26/2018 1539 Last data filed at 10/26/2018 1328 Gross per 24 hour  Intake 1989.41 ml  Output 401 ml  Net 1588.41 ml   Filed Weights   10/24/18 2144 10/25/18 0610  Weight: 79.4 kg 78.8 kg    Examination:  General exam: Appears calm and comfortable Respiratory system: Clear to auscultation. Respiratory effort normal. Cardiovascular system: S1 & S2 heard, RRR. No murmurs, rubs, gallops or clicks. Gastrointestinal system: Abdomen is nondistended, soft and mildly tender on left side. No organomegaly or masses felt. Normal bowel sounds heard. Central nervous system: Alert and oriented. No focal neurological deficits. Extremities: No edema. No calf tenderness Skin: No cyanosis. No rashes Psychiatry: Judgement and insight appear normal. Mood & affect appropriate.     Data Reviewed: I have personally reviewed following labs and imaging studies  CBC: Recent Labs  Lab 10/24/18 2205 10/26/18 0338  WBC 6.9 5.8  HGB 14.5 12.1  HCT 43.6 36.2  MCV 84.2 84.2  PLT 196 179   Basic Metabolic Panel: Recent Labs  Lab 10/24/18 2205 10/25/18 0748 10/26/18 0338  NA 137  --  138  K 3.2*  --  3.2*  CL 104  --  108  CO2 22  --  22  GLUCOSE 122*  --  86  BUN 7  --  <5*  CREATININE 0.80  --  0.76  CALCIUM 9.0  --  7.7*  MG  --  1.8  --    GFR: Estimated Creatinine Clearance: 112.5 mL/min (by C-G formula based on SCr of 0.76 mg/dL). Liver Function Tests: Recent Labs  Lab 10/24/18 2205  AST 29  ALT 19  ALKPHOS 29*  BILITOT 0.4  PROT 7.8  ALBUMIN 4.2   Recent Labs  Lab 10/24/18 2205  LIPASE 28   No results for input(s): AMMONIA in the last 168 hours. Coagulation Profile: No results for input(s): INR, PROTIME in the last 168 hours. Cardiac Enzymes: No results  for input(s): CKTOTAL, CKMB, CKMBINDEX, TROPONINI in the last 168 hours. BNP (last 3 results) No results for input(s): PROBNP in the last 8760 hours. HbA1C: No results for input(s): HGBA1C in the last 72 hours. CBG: No results for input(s): GLUCAP in the last 168 hours. Lipid Profile: No results for input(s): CHOL, HDL, LDLCALC, TRIG, CHOLHDL, LDLDIRECT in the last 72 hours. Thyroid Function Tests: No results for input(s): TSH, T4TOTAL, FREET4, T3FREE, THYROIDAB in the last 72 hours. Anemia Panel: No results for input(s): VITAMINB12, FOLATE, FERRITIN, TIBC, IRON, RETICCTPCT in the last 72 hours. Sepsis Labs: No results for input(s): PROCALCITON, LATICACIDVEN in the last 168 hours.  Recent Results (from the past 240 hour(s))  SARS Coronavirus 2 (CEPHEID - Performed in [facility] hospital lab), Hosp Order     Status: None   Collection Time: 10/25/18  8:44 AM  Result Value Ref Range Status   SARS Coronavirus 2 NEGATIVE NEGATIVE Final    Comment: (NOTE) If result is NEGATIVE SARS-CoV-2 target nucleic acids are NOT DETECTED. The SARS-CoV-2 RNA is generally detectable in upper and lower  respiratory specimens during the acute phase of infection. The lowest  concentration of SARS-CoV-2 viral copies this assay can detect is 250  copies / mL. A negative result does not preclude SARS-CoV-2 infection  and should not be used as the sole basis for treatment or other  patient management decisions.  A negative result may occur with  improper specimen collection / handling, submission of specimen other  than nasopharyngeal swab, presence of viral mutation(s) within the  areas targeted by this assay, and inadequate number of viral copies  (<250 copies / mL). A negative result must be combined with clinical  observations, patient history, and epidemiological information. If result is POSITIVE SARS-CoV-2 target nucleic acids are DETECTED. The SARS-CoV-2 RNA is generally detectable in upper and  lower  respiratory specimens dur ing the acute phase of infection.  Positive  results are indicative of active infection with SARS-CoV-2.  Clinical  correlation with patient history and other diagnostic information is  necessary to determine patient infection status.  Positive results do  not rule out bacterial infection or co-infection with other viruses. If result is PRESUMPTIVE POSTIVE SARS-CoV-2 nucleic acids MAY BE PRESENT.   A presumptive positive result was obtained on the submitted specimen  and confirmed on repeat testing.  While 2019 novel coronavirus  (SARS-CoV-2) nucleic acids may be present in the submitted sample  additional confirmatory testing may be necessary for epidemiological  and / or clinical management purposes  to differentiate between  SARS-CoV-2 and other Sarbecovirus currently known to infect humans.  If clinically indicated additional testing with an alternate test  methodology [id number]) is advised. The SARS-CoV-2 RNA is generally  detectable in upper and lower respiratory sp ecimens during the acute  phase of infection. The expected result is Negative. Fact Sheet for Patients:  BoilerBrush[employer]com.cy Fact Sheet for Healthcare Providers: https://pope.com/ This test  is not yet approved or cleared by the [place] FDA and has been authorized for detection and/or diagnosis of SARS-CoV-2 by FDA under an Emergency Use Authorization (EUA).  This EUA will remain in effect (meaning this test can be used) for the duration of the COVID-19 declaration under Section 564(b)(1) of the Act, 21 U.S.C. section 360bbb-3(b)(1), unless the authorization is terminated or revoked sooner. Performed at [facility], [address] [place], [place] [address]   Urine Culture     Status: Abnormal   Collection Time: 10/25/18  3:31 PM  Result Value Ref Range Status   Specimen Description URINE, RANDOM  Final   Special Requests    Final    NONE Performed at [facility], [address] [place], [place] [address]    Culture MULTIPLE SPECIES PRESENT, SUGGEST RECOLLECTION (A)  Final   Report Status 10/26/2018 FINAL  Final         Radiology Studies: Ct Abdomen Pelvis W Contrast  Result Date: 10/25/2018 CLINICAL DATA:  Abdominal pain EXAM: CT ABDOMEN AND PELVIS WITH CONTRAST TECHNIQUE: Multidetector CT imaging of the abdomen and pelvis was performed using the standard protocol following bolus administration of intravenous contrast. CONTRAST:  100mL OMNIPAQUE IOHEXOL 300 MG/ML  SOLN COMPARISON:  None. FINDINGS: LOWER CHEST: There is no basilar pleural or apical pericardial effusion. HEPATOBILIARY: The hepatic contours and density are normal. There is no intra- or extrahepatic biliary dilatation. The gallbladder is normal. PANCREAS: The pancreatic parenchymal contours are normal and there is no ductal dilatation. There is no peripancreatic fluid collection. SPLEEN: Normal. ADRENALS/URINARY TRACT: --Adrenal glands: Normal. --Right kidney/ureter: No hydronephrosis, nephroureterolithiasis, perinephric stranding or solid renal mass. --Left kidney/ureter: No hydronephrosis, nephroureterolithiasis, perinephric stranding or solid renal mass. --Urinary bladder: Normal for degree of distention STOMACH/BOWEL: --Stomach/Duodenum: There is no hiatal hernia or other gastric abnormality. The duodenal course and caliber are normal. --Small bowel: No dilatation or inflammation. --Colon: No focal abnormality. --Appendix: Not visualized. No right lower quadrant inflammation or free fluid. VASCULAR/LYMPHATIC: Normal course and caliber of the major abdominal vessels. No abdominal or pelvic lymphadenopathy. REPRODUCTIVE: Normal uterus and ovaries. MUSCULOSKELETAL. No bony spinal canal stenosis or focal osseous abnormality. OTHER: None. IMPRESSION: No acute abnormality of the abdomen or pelvis. Electronically Signed   By: Deatra RobinsonKevin  Herman M.D.   On:  10/25/2018 02:06        Scheduled Meds:  enoxaparin (LOVENOX) injection  40 mg Subcutaneous Q24H   metoCLOPramide (REGLAN) injection  5 mg Intravenous Q8H   sodium chloride flush  3 mL Intravenous Q12H   Continuous Infusions:  sodium chloride Stopped (10/25/18 0454)   sodium chloride 100 mL/hr at 10/26/18 0908   cefTRIAXone (ROCEPHIN)  IV 1 g (10/26/18 1336)   famotidine (PEPCID) IV 20 mg (10/26/18 1010)     LOS: 0 days     Jacquelin Hawkingalph Ameir Faria, MD [facility] 10/26/2018, 3:39 PM  If 7PM-7AM, please contact night-coverage [email or web address]

## 2018-10-27 DIAGNOSIS — R197 Diarrhea, unspecified: Secondary | ICD-10-CM | POA: Diagnosis not present

## 2018-10-27 DIAGNOSIS — R112 Nausea with vomiting, unspecified: Secondary | ICD-10-CM | POA: Diagnosis not present

## 2018-10-27 LAB — BASIC METABOLIC PANEL
Anion gap: 9 (ref 5–15)
BUN: <5 mg/dL — ABNORMAL LOW (ref 6–20)
CO2: 23 mmol/L (ref 22–32)
Calcium: 8.2 mg/dL — ABNORMAL LOW (ref 8.9–10.3)
Chloride: 106 mmol/L (ref 98–111)
Creatinine, Ser: 0.79 mg/dL (ref 0.44–1.00)
GFR calc Af Amer: >60 mL/min (ref >60)
GFR calc non Af Amer: >60 mL/min (ref >60)
Glucose, Bld: 89 mg/dL (ref 70–99)
Potassium: 3.9 mmol/L (ref 3.5–5.1)
Sodium: 138 mmol/L (ref 135–145)

## 2018-10-27 MED ORDER — ONDANSETRON 4 MG PO TBDP: 4.0000 mg | ORAL_TABLET | Freq: Three times a day (TID) | ORAL | 0 refills | Status: AC | PRN

## 2018-10-27 NOTE — Discharge Summary (Signed)
Physician Discharge Summary  Maureen Raymond ZOX:[id number] DOB: 1995/10/28 DOA: 10/24/2018  PCP: System, Pcp Not In  Admit date: 10/24/2018 Discharge date: 10/27/2018  Admitted From: Home Disposition: Home  Recommendations for Outpatient Follow-up:  1. Follow up with PCP in 1 week 2. Please obtain BMP/CBC in one week 3. Please follow up on the following pending results: None  Home Health: None Equipment/Devices: None  Discharge Condition: Stable CODE STATUS: Full code Diet recommendation: Regular   Brief/Interim Summary:  Admission HPI written by Clydie Braun, MD   HPI: Maureen Raymond is a 23 y.o. female with medical history significant of mild intermittent asthma and acid reflux; who presents with complaints of nausea, vomiting, and diarrhea over the last week.  Reports that she had seafood on Friday and ate leftovers on Saturday.  On Monday, she started having nonbloody diarrhea with up to 6 bowel movements per day.  Nausea and vomiting symptoms started yesterday morning around 4 AM.  Emesis reported to just be of stomach contents.  She has been unable to keep any significant food or liquids down.  Associated symptoms include malaise and left sided abdominal pain.  Nothing relieves symptoms.  She admits to smoking marijuana daily.  2-3 months ago she had a dental infection.  For at least 2 weeks or so had been taking ibuprofen regularly until she had be root canal approximately 1 month ago. Does make note of childhood history of ulcers, but unable to give any additional information.  Takes over-the-counter antacid medications for reflux.  Denies having any fever, dysuria, urinary frequency, or flank pain.  Patient reports that she just recently started her menstrual cycle.  ED Course: Upon admission into the emergency department patient was noted to be afebrile with heart rates 51-69, and all other vital signs within normal limits.  Significant labs included WBC 6.9, potassium  3.2, and lipase 28.  Urine pregnancy screen was negative.  Urinalysis was positive for hematuria and many bacteria.  Although urinalysis was negative for nitrites, leukocytes, and WBCs.  UDS was positive for marijuana.  CT scan of the abdomen and pelvis did not show any acute abnormalities.  She received at least 1 L of IV fluids, Pepcid IV, several doses of Zofran, Phenergan, 30 mg of ketorolac IV, and capsaicin placed to her abdomen.  Patient was accepted to a MedSurg bed here at [facility].  COVID-19 testing was negative.    Hospital course:  Nausea/vomiting/diarrhea Presumed infectious with possible hyperemesis cannabinoid syndrome contributing. Diarrhea resolved. Afebrile. Small formed stool overnight. Still with significant nausea prohibiting ability to take oral nutrition today. Patient also uses marijuana chronically and stopped using with diarrheal symptoms one week ago. Vomiting started two days prior to admission. No vaginal discharge or pelvic tenderness; CT scan without suggestion of uterus inflammation. Symptoms resolved and discharged with Zofran PO as needed.  Abdominal pain Left sided, possibly secondary to vomiting previously. CT scan was unremarkable.  GERD Pepcid while inpatient. Can use OTC as needed.  UTI Asymptomatic but concern for abnormal urinalysis. Urine culture with multiple species. Discontinued ceftriaxone.  Discharge Diagnoses:  Principal Problem:   Nausea vomiting and diarrhea Active Problems:   Tobacco abuse   Hypokalemia   GERD (gastroesophageal reflux disease)   History of asthma    Discharge Instructions  Discharge Instructions    Call MD for:  difficulty breathing, headache or visual disturbances   Complete by:  As directed    Call MD for:  persistant  dizziness or light-headedness   Complete by:  As directed    Call MD for:  persistant nausea and vomiting   Complete by:  As directed    Call MD for:  severe uncontrolled pain   Complete by:   As directed    Call MD for:  temperature >100.4   Complete by:  As directed    Increase activity slowly   Complete by:  As directed      Allergies as of 10/27/2018   No Known Allergies     Medication List    STOP taking these medications   HYDROcodone-acetaminophen 7.5-325 mg/15 ml solution Commonly known as:  HYCET     TAKE these medications   albuterol 108 (90 Base) MCG/ACT inhaler Commonly known as:  VENTOLIN HFA Inhale 2 puffs into the lungs every 6 (six) hours as needed for wheezing or shortness of breath.   ibuprofen 100 MG/5ML suspension Commonly known as:  ADVIL Take 200 mg by mouth every 4 (four) hours as needed (pain/headache/cramps).   multivitamin with minerals Tabs tablet Take 1 tablet by mouth daily.   ondansetron 4 MG disintegrating tablet Commonly known as:  Zofran ODT Take 1 tablet (4 mg total) by mouth every 8 (eight) hours as needed for nausea or vomiting.      Follow-up Information    [facility] EMERGENCY DEPARTMENT.   Specialty:  Emergency Medicine Why:  As needed, If symptoms worsen Contact information: [address] [id number] mc [address] [place] [address] [phone]         No Known Allergies  Consultations:  None   Procedures/Studies: Ct Abdomen Pelvis W Contrast  Result Date: 10/25/2018 CLINICAL DATA:  Abdominal pain EXAM: CT ABDOMEN AND PELVIS WITH CONTRAST TECHNIQUE: Multidetector CT imaging of the abdomen and pelvis was performed using the standard protocol following bolus administration of intravenous contrast. CONTRAST:  OMNIPAQUE IOHEXOL 300 MG/ML  SOLN COMPARISON:  None. FINDINGS: LOWER CHEST: There is no basilar pleural or apical pericardial effusion. HEPATOBILIARY: The hepatic contours and density are normal. There is no intra- or extrahepatic biliary dilatation. The gallbladder is normal. PANCREAS: The pancreatic parenchymal contours are normal and there is no ductal dilatation. There is  no peripancreatic fluid collection. SPLEEN: Normal. ADRENALS/URINARY TRACT: --Adrenal glands: Normal. --Right kidney/ureter: No hydronephrosis, nephroureterolithiasis, perinephric stranding or solid renal mass. --Left kidney/ureter: No hydronephrosis, nephroureterolithiasis, perinephric stranding or solid renal mass. --Urinary bladder: Normal for degree of distention STOMACH/BOWEL: --Stomach/Duodenum: There is no hiatal hernia or other gastric abnormality. The duodenal course and caliber are normal. --Small bowel: No dilatation or inflammation. --Colon: No focal abnormality. --Appendix: Not visualized. No right lower quadrant inflammation or free fluid. VASCULAR/LYMPHATIC: Normal course and caliber of the major abdominal vessels. No abdominal or pelvic lymphadenopathy. REPRODUCTIVE: Normal uterus and ovaries. MUSCULOSKELETAL. No bony spinal canal stenosis or focal osseous abnormality. OTHER: None. IMPRESSION: No acute abnormality of the abdomen or pelvis. Electronically Signed   By: Deatra Robinson M.D.   On: 10/25/2018 02:06     Subjective: No nausea today. Abdominal pain resolved.  Discharge Exam: Vitals:   10/27/18 0523 10/27/18 0931  BP: 118/75 126/68  Pulse: (!) 48 (!) 54  Resp: 18 16  Temp: 98.3 F (36.8 C) 98.3 F (36.8 C)  SpO2: 100% 100%   Vitals:   10/26/18 1700 10/26/18 2051 10/27/18 0523 10/27/18 0931  BP: 121/72 120/76 118/75 126/68  Pulse: (!) 55 (!) 54 (!) 48 (!) 54  Resp: 16  Temp: 98.2 F (36.8 C) 98.2 F (36.8 C) 98.3 F (36.8 C) 98.3 F (36.8 C)  TempSrc: Oral Oral Oral Oral  SpO2: 100% 99% 100% 100%  Weight:  72.9 kg    Height:        General: Pt is alert, awake, not in acute distress Cardiovascular: RRR, S1/S2 +, no rubs, no gallops Respiratory: CTA bilaterally, no wheezing, no rhonchi Abdominal: Soft, NT, ND, bowel sounds + Extremities: no edema, no cyanosis    The results of significant diagnostics from this hospitalization (including imaging,  microbiology, ancillary and laboratory) are listed below for reference.     Microbiology: Recent Results (from the past 240 hour(s))  SARS Coronavirus 2 (CEPHEID - Performed in [facility] hospital lab), Hosp Order     Status: None   Collection Time: 10/25/18  8:44 AM  Result Value Ref Range Status   SARS Coronavirus 2 NEGATIVE NEGATIVE Final    Comment: (NOTE) If result is NEGATIVE SARS-CoV-2 target nucleic acids are NOT DETECTED. The SARS-CoV-2 RNA is generally detectable in upper and lower  respiratory specimens during the acute phase of infection. The lowest  concentration of SARS-CoV-2 viral copies this assay can detect is 250  copies / mL. A negative result does not preclude SARS-CoV-2 infection  and should not be used as the sole basis for treatment or other  patient management decisions.  A negative result may occur with  improper specimen collection / handling, submission of specimen other  than nasopharyngeal swab, presence of viral mutation(s) within the  areas targeted by this assay, and inadequate number of viral copies  (<250 copies / mL). A negative result must be combined with clinical  observations, patient history, and epidemiological information. If result is POSITIVE SARS-CoV-2 target nucleic acids are DETECTED. The SARS-CoV-2 RNA is generally detectable in upper and lower  respiratory specimens dur ing the acute phase of infection.  Positive  results are indicative of active infection with SARS-CoV-2.  Clinical  correlation with patient history and other diagnostic information is  necessary to determine patient infection status.  Positive results do  not rule out bacterial infection or co-infection with other viruses. If result is PRESUMPTIVE POSTIVE SARS-CoV-2 nucleic acids MAY BE PRESENT.   A presumptive positive result was obtained on the submitted specimen  and confirmed on repeat testing.  While 2019 novel coronavirus  (SARS-CoV-2) nucleic acids may be  present in the submitted sample  additional confirmatory testing may be necessary for epidemiological  and / or clinical management purposes  to differentiate between  SARS-CoV-2 and other Sarbecovirus currently known to infect humans.  If clinically indicated additional testing with an alternate test  methodology [phone]) is advised. The SARS-CoV-2 RNA is generally  detectable in upper and lower respiratory sp ecimens during the acute  phase of infection. The expected result is Negative. Fact Sheet for Patients:  BoilerBrush.com.cy Fact Sheet for Healthcare Providers: https://pope.com/ This test is not yet approved or cleared by the [place] FDA and has been authorized for detection and/or diagnosis of SARS-CoV-2 by FDA under an Emergency Use Authorization (EUA).  This EUA will remain in effect (meaning this test can be used) for the duration of the COVID-19 declaration under Section 564(b)(1) of the Act, 21 U.S.C. section 360bbb-3(b)(1), unless the authorization is terminated or revoked sooner. Performed at [facility], [address], [place], [place] [address]   Urine Culture     Status: Abnormal   Collection Time: 10/25/18  3:31 PM  Result Value Ref Range Status   Specimen Description URINE, RANDOM  Final   Special Requests   Final    NONE Performed at [facility], [address], [place], [place] [address]    Culture MULTIPLE SPECIES PRESENT, SUGGEST RECOLLECTION (A)  Final   Report Status 10/26/2018 FINAL  Final     Labs: BNP (last 3 results) No results for input(s): BNP in the last 8760 hours. Basic Metabolic Panel: Recent Labs  Lab 10/24/18 2205 10/25/18 0748 10/26/18 0338 10/27/18 0553  NA 137  --  138 138  K 3.2*  --  3.2* 3.9  CL 104  --  108 106  CO2 22  --  22 23  GLUCOSE 122*  --  86 89  BUN 7  --  <5* <5*  CREATININE 0.80  --  0.76 0.79  CALCIUM 9.0  --  7.7* 8.2*  MG  --  1.8  --    --    Liver Function Tests: Recent Labs  Lab 10/24/18 2205  AST 29  ALT 19  ALKPHOS 29*  BILITOT 0.4  PROT 7.8  ALBUMIN 4.2   Recent Labs  Lab 10/24/18 2205  LIPASE 28   No results for input(s): AMMONIA in the last 168 hours. CBC: Recent Labs  Lab 10/24/18 2205 10/26/18 0338  WBC 6.9 5.8  HGB 14.5 12.1  HCT 43.6 36.2  MCV 84.2 84.2  PLT 196 179   Cardiac Enzymes: No results for input(s): CKTOTAL, CKMB, CKMBINDEX, TROPONINI in the last 168 hours. BNP: Invalid input(s): POCBNP CBG: No results for input(s): GLUCAP in the last 168 hours. D-Dimer No results for input(s): DDIMER in the last 72 hours. Hgb A1c No results for input(s): HGBA1C in the last 72 hours. Lipid Profile No results for input(s): CHOL, HDL, LDLCALC, TRIG, CHOLHDL, LDLDIRECT in the last 72 hours. Thyroid function studies No results for input(s): TSH, T4TOTAL, T3FREE, THYROIDAB in the last 72 hours.  Invalid input(s): FREET3 Anemia work up No results for input(s): VITAMINB12, FOLATE, FERRITIN, TIBC, IRON, RETICCTPCT in the last 72 hours. Urinalysis    Component Value Date/Time   COLORURINE YELLOW 10/25/2018 0013   APPEARANCEUR HAZY (A) 10/25/2018 0013   LABSPEC 1.020 10/25/2018 0013   PHURINE 8.5 (H) 10/25/2018 0013   GLUCOSEU NEGATIVE 10/25/2018 0013   HGBUR LARGE (A) 10/25/2018 0013   BILIRUBINUR NEGATIVE 10/25/2018 0013   KETONESUR >80 (A) 10/25/2018 0013   PROTEINUR NEGATIVE 10/25/2018 0013   NITRITE NEGATIVE 10/25/2018 0013   LEUKOCYTESUR NEGATIVE 10/25/2018 0013   Sepsis Labs Invalid input(s): PROCALCITONIN,  WBC,  LACTICIDVEN Microbiology Recent Results (from the past 240 hour(s))  SARS Coronavirus 2 (CEPHEID - Performed in [facility] hospital lab), Hosp Order     Status: None   Collection Time: 10/25/18  8:44 AM  Result Value Ref Range Status   SARS Coronavirus 2 NEGATIVE NEGATIVE Final    Comment: (NOTE) If result is NEGATIVE SARS-CoV-2 target nucleic acids are NOT  DETECTED. The SARS-CoV-2 RNA is generally detectable in upper and lower  respiratory specimens during the acute phase of infection. The lowest  concentration of SARS-CoV-2 viral copies this assay can detect is 250  copies / mL. A negative result does not preclude SARS-CoV-2 infection  and should not be used as the sole basis for treatment or other  patient management decisions.  A negative result may occur with  improper specimen collection / handling, submission of specimen other  than nasopharyngeal swab, presence of  viral mutation(s) within the  areas targeted by this assay, and inadequate number of viral copies  (<250 copies / mL). A negative result must be combined with clinical  observations, patient history, and epidemiological information. If result is POSITIVE SARS-CoV-2 target nucleic acids are DETECTED. The SARS-CoV-2 RNA is generally detectable in upper and lower  respiratory specimens dur ing the acute phase of infection.  Positive  results are indicative of active infection with SARS-CoV-2.  Clinical  correlation with patient history and other diagnostic information is  necessary to determine patient infection status.  Positive results do  not rule out bacterial infection or co-infection with other viruses. If result is PRESUMPTIVE POSTIVE SARS-CoV-2 nucleic acids MAY BE PRESENT.   A presumptive positive result was obtained on the submitted specimen  and confirmed on repeat testing.  While 2019 novel coronavirus  (SARS-CoV-2) nucleic acids may be present in the submitted sample  additional confirmatory testing may be necessary for epidemiological  and / or clinical management purposes  to differentiate between  SARS-CoV-2 and other Sarbecovirus currently known to infect humans.  If clinically indicated additional testing with an alternate test  methodology [phone]([id number]) is advised. The SARS-CoV-2 RNA is generally  detectable in upper and lower respiratory sp ecimens during  the acute  phase of infection. The expected result is Negative. Fact Sheet for Patients:  BoilerBrush.com.cyhttps://www.fda.gov/media/136312/download Fact Sheet for Healthcare Providers: https://pope.com/https://www.fda.gov/media/136313/download This test is not yet approved or cleared by the [place] FDA and has been authorized for detection and/or diagnosis of SARS-CoV-2 by FDA under an Emergency Use Authorization (EUA).  This EUA will remain in effect (meaning this test can be used) for the duration of the COVID-19 declaration under Section 564(b)(1) of the Act, 21 U.S.C. section 360bbb-3(b)(1), unless the authorization is terminated or revoked sooner. Performed at [facility], [address] [place], [place] [address]   Urine Culture     Status: Abnormal   Collection Time: 10/25/18  3:31 PM  Result Value Ref Range Status   Specimen Description URINE, RANDOM  Final   Special Requests   Final    NONE Performed at [facility], [address] [place], [place] [address]    Culture MULTIPLE SPECIES PRESENT, SUGGEST RECOLLECTION (A)  Final   Report Status 10/26/2018 FINAL  Final    SIGNED:   Jacquelin Hawkingalph Laynee Lockamy, MD [facility] 10/27/2018, 11:15 AM

## 2018-10-27 NOTE — Progress Notes (Signed)
Maureen Raymond to be discharged home per MD order. Discussed prescriptions and follow up appointments with the patient. Prescriptions given to patient; medication list explained in detail. Patient verbalized understanding.  Skin clean, dry and intact without evidence of skin break down, no evidence of skin tears noted. IV catheter discontinued intact. Site without signs and symptoms of complications. Dressing and pressure applied. Pt denies pain at the site currently. No complaints noted.  Patient free of lines, drains, and wounds.   An After Visit Summary (AVS) was printed and given to the patient. Patient escorted via wheelchair, and discharged home via private auto.  Gladstone Pih, RN

## 2018-10-27 NOTE — Discharge Instructions (Signed)
Maureen Raymond,  You were in the hospital because of uncontrolled nausea and abdominal pain. This may have been contributed to by marijuana use and withdrawal. You will be discharged with Zofran to use as needed.

## 2018-10-27 NOTE — Progress Notes (Signed)
Patient doesn't want to order a soft diet. Patient wants to wait until she's discharged to eat something solid.   Lillia Pauls RN

## 2019-01-26 ENCOUNTER — Other Ambulatory Visit: Payer: Self-pay

## 2019-01-26 DIAGNOSIS — Z20822 Contact with and (suspected) exposure to covid-19: Secondary | ICD-10-CM

## 2019-01-28 ENCOUNTER — Ambulatory Visit: Payer: Self-pay

## 2019-01-28 LAB — NOVEL CORONAVIRUS, NAA: SARS-CoV-2, NAA: DETECTED — AB

## 2019-01-28 NOTE — Telephone Encounter (Signed)
Provide positive covid test result to Patient voiced understanding,yet dosent believe she  Has the virus.  Denies any symptoms. Pro vided care advice to patient voices understanding.

## 2019-01-28 NOTE — Telephone Encounter (Signed)
Incoming call from patient stating she received a call and is returning call .  Related that I did not call patient back Asked if she had any questions stated no.

## 2019-01-28 NOTE — Telephone Encounter (Signed)
Incoming call from Patient requesting covid test results.  Provided results provided care advice  and recommendations.       Reason for Disposition . [1] [id number] diagnosed by positive lab test AND [2] mild symptoms (e.g., cough, fever, others) AND [5] no complications or SOB  Protocols used: CORONAVIRUS (COVID-19) DIAGNOSED OR SUSPECTED-A-AH

## 2019-08-22 IMAGING — DX DG WRIST COMPLETE 3+V*L*
3 series · 3 of 3 positions shown · non-contrast
Comparison: None.

CLINICAL DATA: Wrist pain after injury.

EXAM:
LEFT WRIST - COMPLETE 3+ VIEW

[wrist pa]
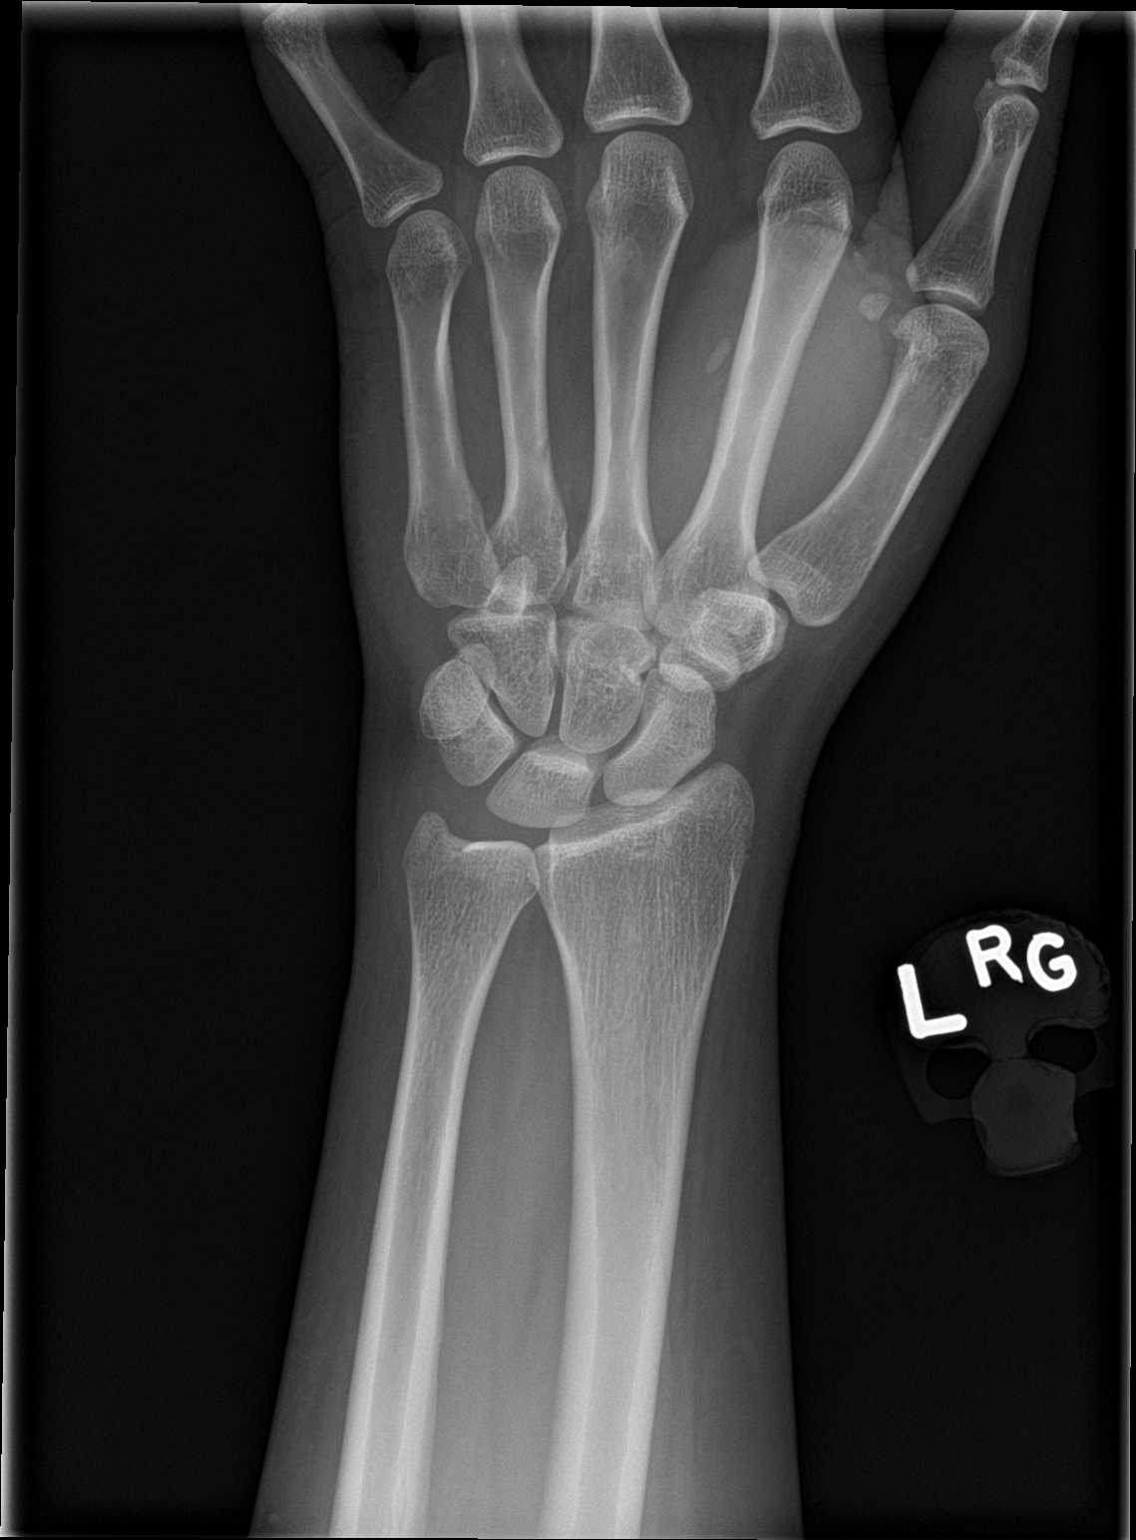

[wrist obl]
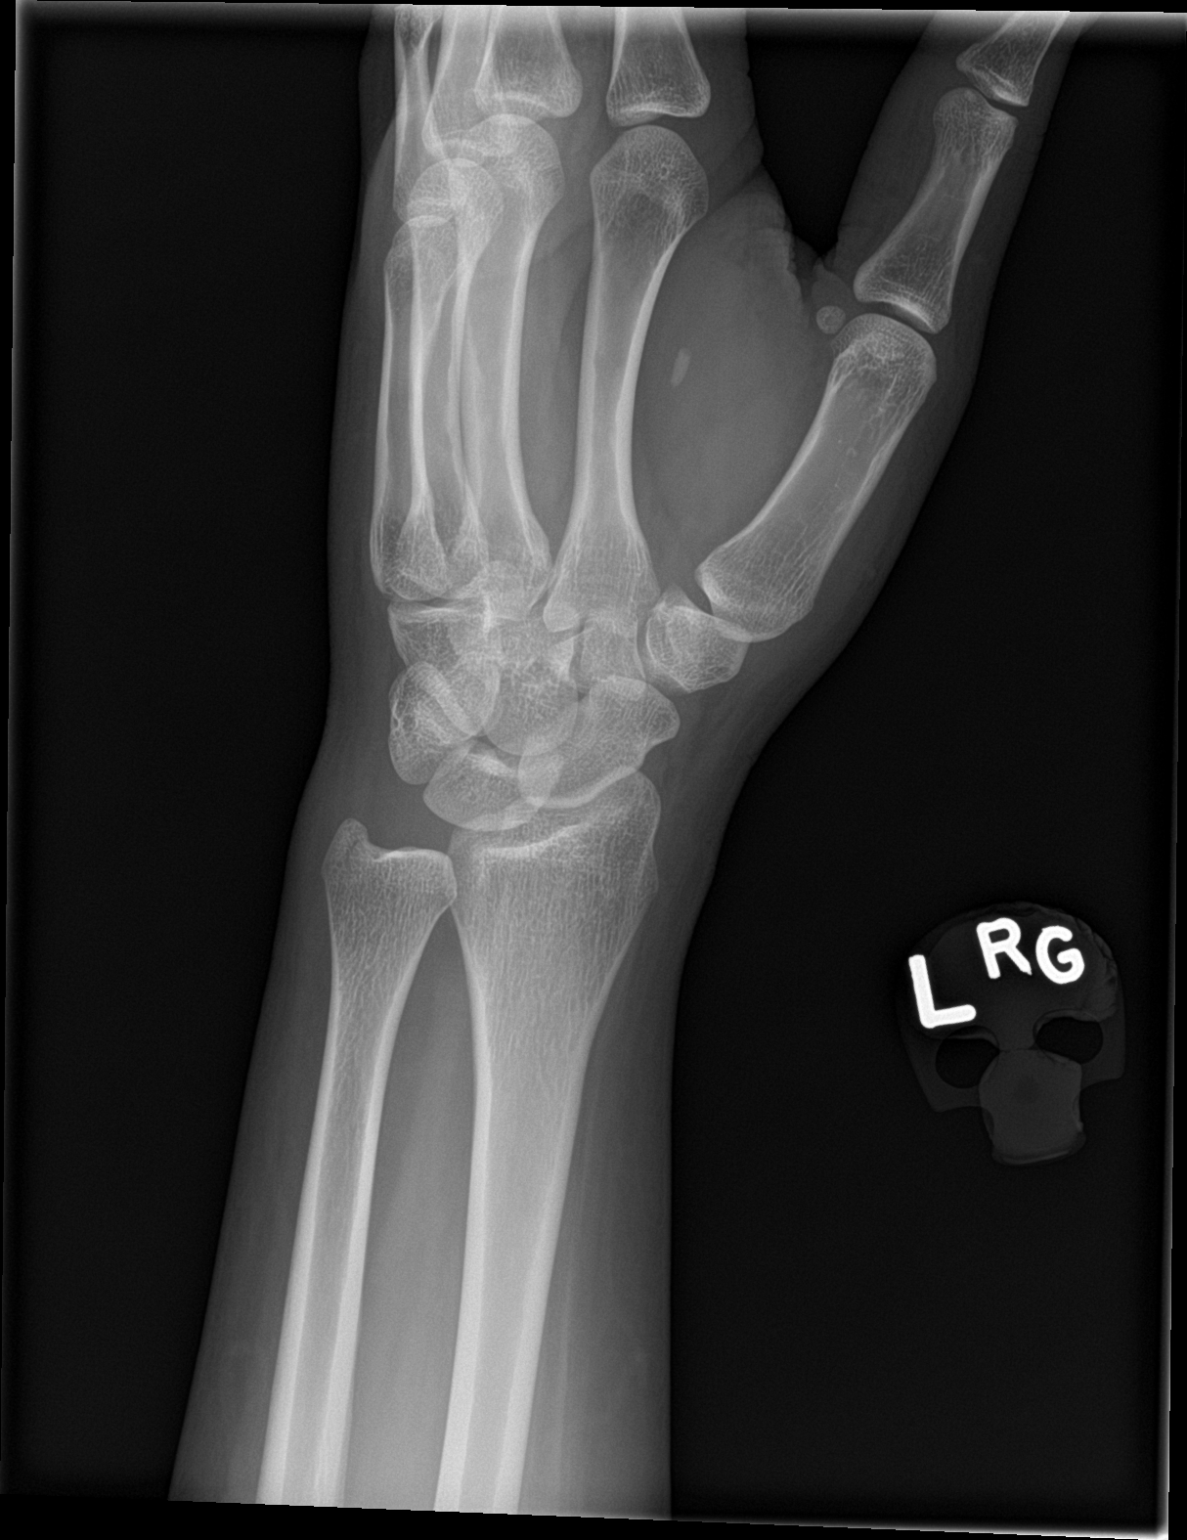

[wrist navicular]
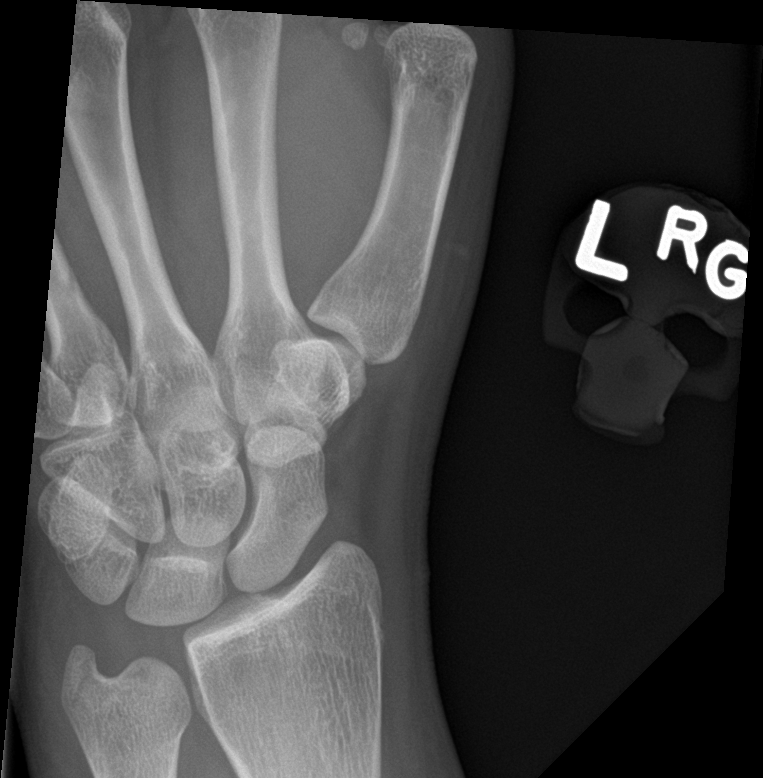

[3 of 3 positions shown; findings below may reference images not displayed]

FINDINGS: Exam reviewed along with the forearm films contain lateral
projection.No fracture. No subluxation or dislocation. No worrisome
lytic or sclerotic osseous abnormality. 2 x 4 mm oval radiopaque
foreign body projects over the thenar eminence.
IMPRESSION: 1. No evidence for an acute fracture.
2. Small radiodense foreign body projects over the thenar eminence.

## 2020-10-01 IMAGING — CT CT ABDOMEN AND PELVIS WITH CONTRAST
2 of 4 series · 15 of 46 positions shown, 17 images · IV contrast (omnipaque)
Comparison: None.

CLINICAL DATA: Abdominal pain

EXAM:
CT ABDOMEN AND PELVIS WITH CONTRAST
TECHNIQUE: Multidetector CT imaging of the abdomen and pelvis was performed
using the standard protocol following bolus administration of
intravenous contrast.
CONTRAST:  100mL OMNIPAQUE IOHEXOL 300 MG/ML  SOLN

[Series 2: axial st · axial · 0.60mm/px · z∈[-543,-148]mm · 12 of 91 slices shown, 14 images]
[im 8/91  soft-tissue]
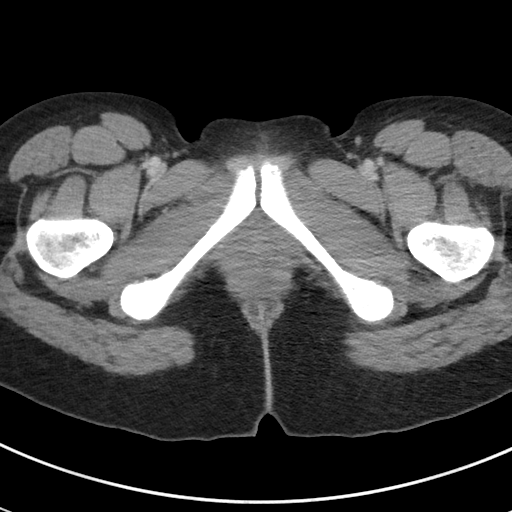
[im 8/91  bone]
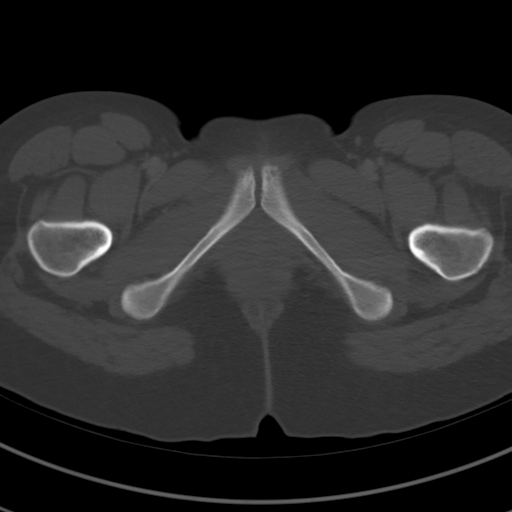
[im 15/91  soft-tissue]
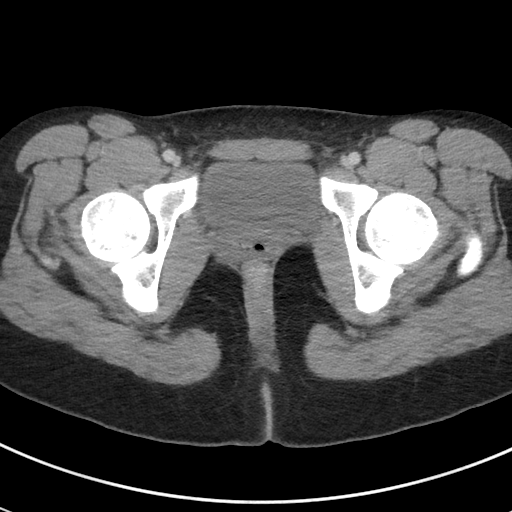
[im 22/91  soft-tissue]
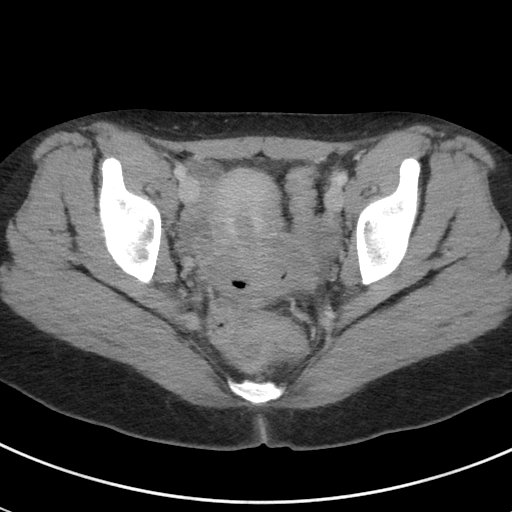
[im 29/91  soft-tissue]
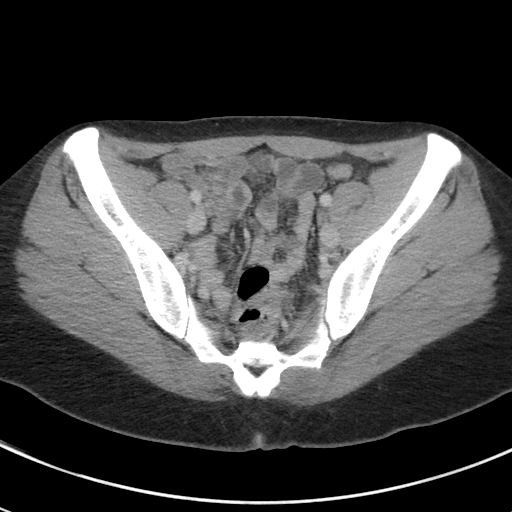
[im 37/91  soft-tissue]
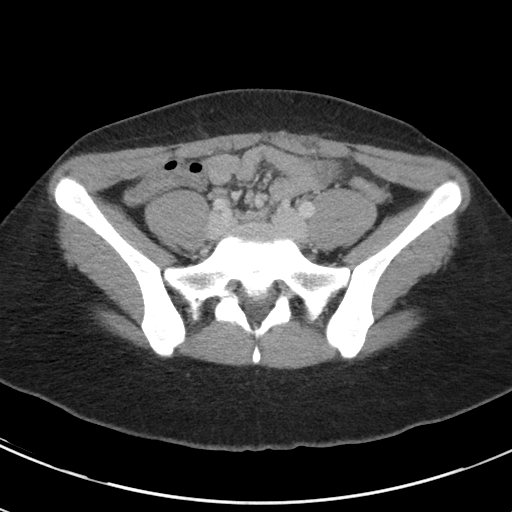
[im 44/91  soft-tissue]
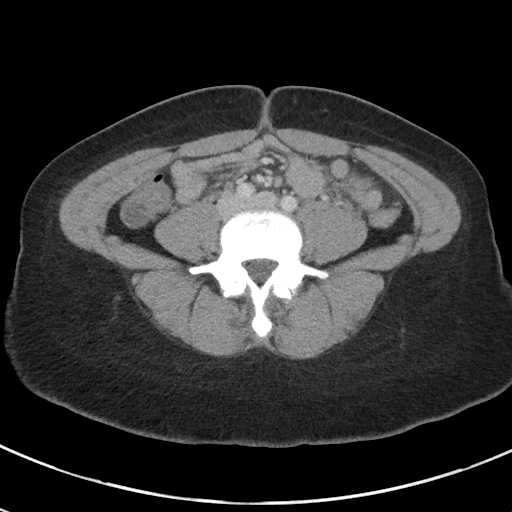
[im 51/91  soft-tissue]
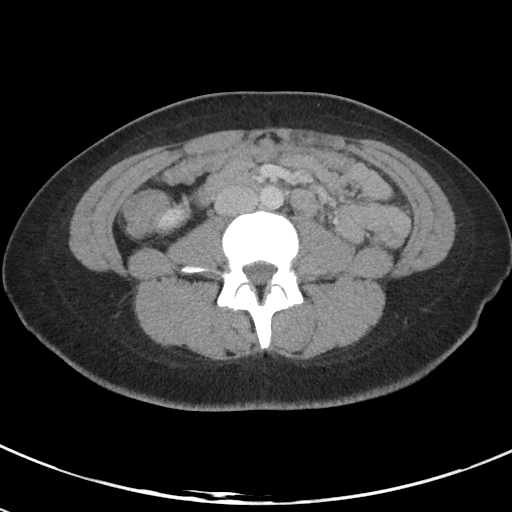
[im 58/91  soft-tissue]
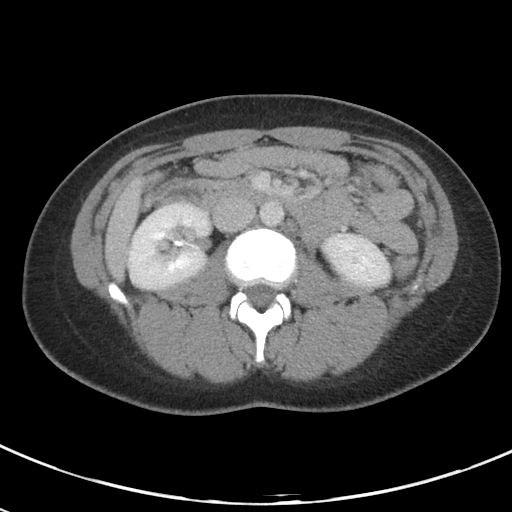
[im 65/91  soft-tissue]
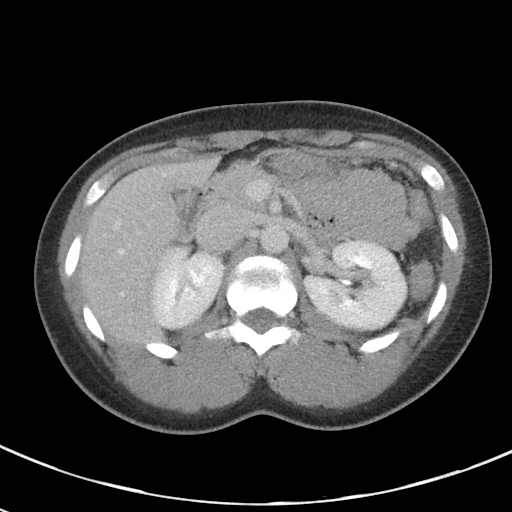
[im 65/91  bone]
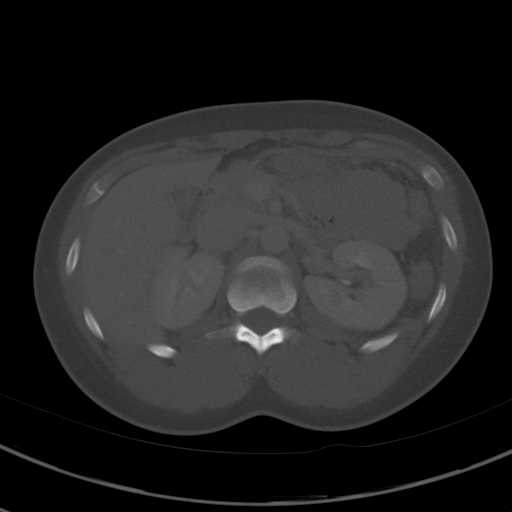
[im 73/91  soft-tissue]
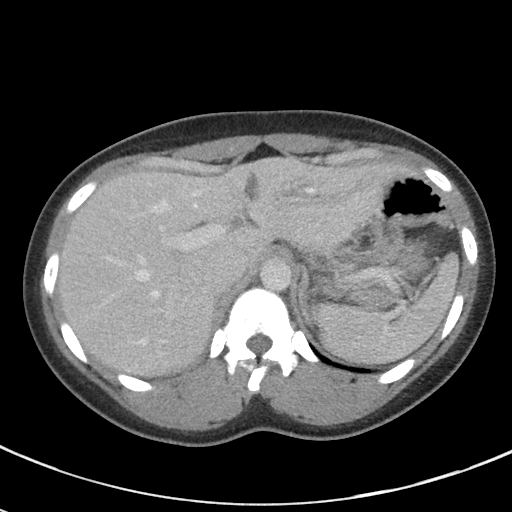
[im 80/91  soft-tissue]
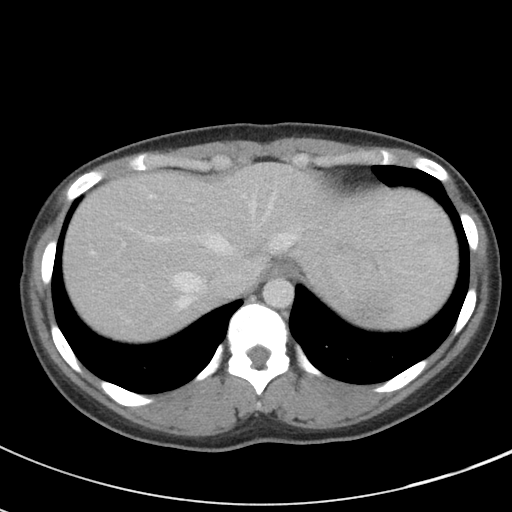
[im 87/91  soft-tissue]
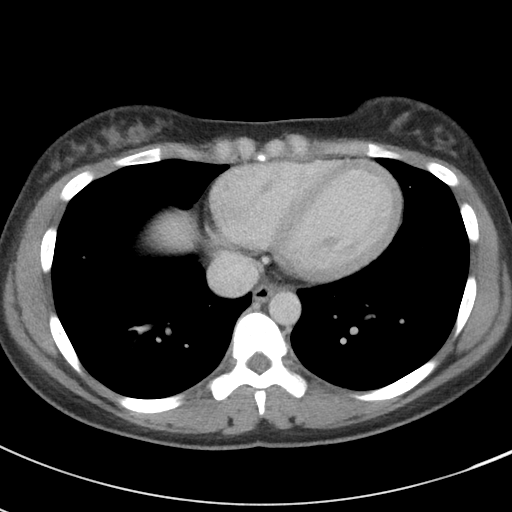

[Series 5: coronal st · coronal · 0.62mm/px · 3 of 76 slices shown]
[im 26/76  soft-tissue]
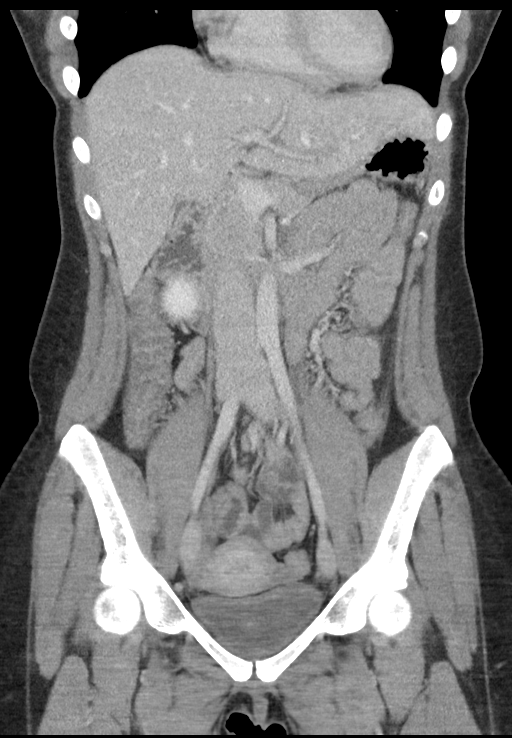
[im 34/76  soft-tissue]
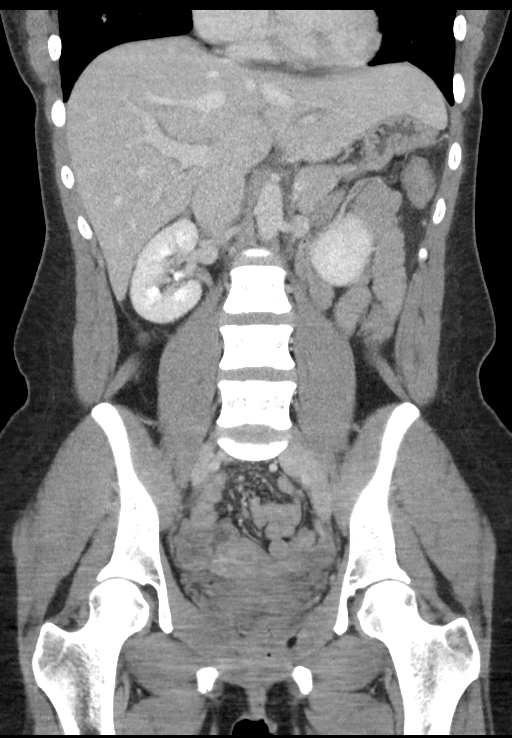
[im 42/76  soft-tissue]
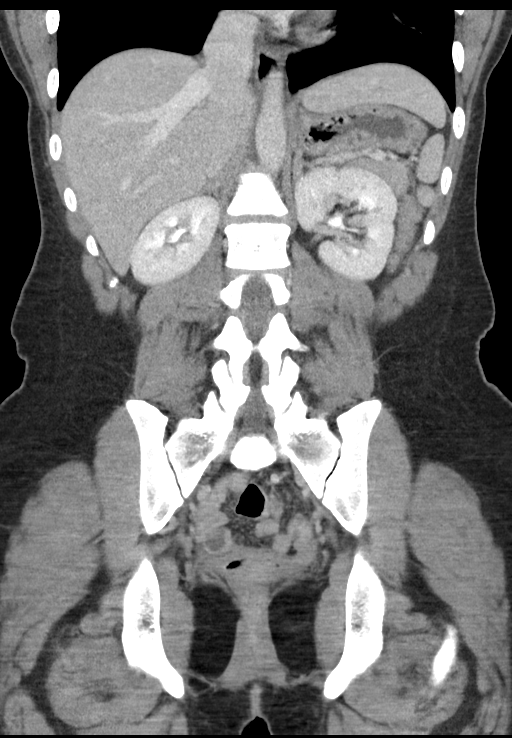

[15 of 46 positions shown; findings below may reference images not displayed]

FINDINGS: LOWER CHEST: There is no basilar pleural or apical pericardial
effusion.

HEPATOBILIARY: The hepatic contours and density are normal. There is
no intra- or extrahepatic biliary dilatation. The gallbladder is
normal.

PANCREAS: The pancreatic parenchymal contours are normal and there
is no ductal dilatation. There is no peripancreatic fluid
collection.

SPLEEN: Normal.

ADRENALS/URINARY TRACT:

--Adrenal glands: Normal.

--Right kidney/ureter: No hydronephrosis, nephroureterolithiasis,
perinephric stranding or solid renal mass.

--Left kidney/ureter: No hydronephrosis, nephroureterolithiasis,
perinephric stranding or solid renal mass.

--Urinary bladder: Normal for degree of distention

STOMACH/BOWEL:

--Stomach/Duodenum: There is no hiatal hernia or other gastric
abnormality. The duodenal course and caliber are normal.

--Small bowel: No dilatation or inflammation.

--Colon: No focal abnormality.

--Appendix: Not visualized. No right lower quadrant inflammation or
free fluid.

VASCULAR/LYMPHATIC: Normal course and caliber of the major abdominal
vessels. No abdominal or pelvic lymphadenopathy.

REPRODUCTIVE: Normal uterus and ovaries.

MUSCULOSKELETAL. No bony spinal canal stenosis or focal osseous
abnormality.

OTHER: None.
IMPRESSION: No acute abnormality of the abdomen or pelvis.

## 2023-02-05 ENCOUNTER — Encounter (HOSPITAL_BASED_OUTPATIENT_CLINIC_OR_DEPARTMENT_OTHER): Payer: Self-pay

## 2023-02-05 ENCOUNTER — Other Ambulatory Visit: Payer: Self-pay

## 2023-02-05 ENCOUNTER — Emergency Department (HOSPITAL_BASED_OUTPATIENT_CLINIC_OR_DEPARTMENT_OTHER)
Admission: EM | Admit: 2023-02-05 | Discharge: 2023-02-05 | Disposition: A | Discharge status: Home / Self Care | Payer: No Typology Code available for payment source | Attending: Emergency Medicine | Admitting: Emergency Medicine

## 2023-02-05 DIAGNOSIS — H1032 Unspecified acute conjunctivitis, left eye: Secondary | ICD-10-CM | POA: Insufficient documentation

## 2023-02-05 MED ORDER — CIPROFLOXACIN HCL 0.3 % OP SOLN
2.0000 [drp] | Freq: Once | OPHTHALMIC | Status: AC
  Administered 2023-02-05: 2 [drp] via OPHTHALMIC
  Filled 2023-02-05: qty 2.5

## 2023-02-05 NOTE — ED Provider Notes (Signed)
   [facility]  Provider Note  CSN: [id number] Arrival date & time: 02/05/23 0356  History Chief Complaint  Patient presents with   Conjunctivitis    Maureen Raymond is a 27 y.o. female presents with son, both have red eyes. Son had it first from day care. Mother noted redness in L eye at work this evening, woke up a short time ago with crusting eye. She wears contact lenses.    Home Medications Prior to Admission medications   Medication Sig Start Date End Date Taking? Authorizing Provider  albuterol (VENTOLIN HFA) 108 (90 Base) MCG/ACT inhaler Inhale 2 puffs into the lungs every 6 (six) hours as needed for wheezing or shortness of breath.    [provider]  ibuprofen (ADVIL) 100 MG/5ML suspension Take 200 mg by mouth every 4 (four) hours as needed (pain/headache/cramps).    [provider]  Multiple Vitamin (MULTIVITAMIN WITH MINERALS) TABS tablet Take 1 tablet by mouth daily.    [provider]  ondansetron (ZOFRAN-ODT) 4 MG disintegrating tablet Take 1 tablet (4 mg total) by mouth every 8 (eight) hours as needed for nausea or vomiting. 10/27/18   Narda Bonds, MD     Allergies    Patient has no known allergies.   Review of Systems   Review of Systems Please see HPI for pertinent positives and negatives  Physical Exam BP 129/83   Pulse 96   Temp 98.2 F (36.8 C) (Oral)   Resp 15   Ht 5\' 5"  (1.651 m)   Wt 101.6 kg   SpO2 96%   BMI 37.28 kg/m   Physical Exam Vitals and nursing note reviewed.  HENT:     Head: Normocephalic.     Nose: Nose normal.  Eyes:     Extraocular Movements: Extraocular movements intact.     Comments: Conjunctival injection in L eye, anterior chamber is clear. R eye appears normal  Pulmonary:     Effort: Pulmonary effort is normal.  Musculoskeletal:        General: Normal range of motion.     Cervical back: Neck supple.  Skin:    Findings: No rash (on exposed  skin).  Neurological:     Mental Status: She is alert and oriented to person, place, and time.  Psychiatric:        Mood and Affect: Mood normal.     ED Results / Procedures / Treatments   EKG None  Procedures Procedures  Medications Ordered in the ED Medications  ciprofloxacin (CILOXAN) 0.3 % ophthalmic solution 2 drop (has no administration in time range)    Initial Impression and Plan  Patient here with conjunctivitis, suspect viral but given she is contact lens wearer will treat with Cipro drops. Advised to throw away current contacts and to wear glasses until she is done with Abx.   ED Course       MDM Rules/Calculators/A&P Medical Decision Making Problems Addressed: Acute conjunctivitis of left eye, unspecified acute conjunctivitis type: acute illness or injury  Risk Prescription drug management.     Final Clinical Impression(s) / ED Diagnoses Final diagnoses:  Acute conjunctivitis of left eye, unspecified acute conjunctivitis type    Rx / DC Orders ED Discharge Orders     None        Pollyann Savoy, MD 02/05/23 [phone]

## 2023-02-05 NOTE — Discharge Instructions (Signed)
Use the antibiotic ointment every 2 hours while awake for the first 2 days and then four times a day for the next 5 days.

## 2023-02-05 NOTE — ED Triage Notes (Signed)
Mother reports swelling and redness to left eye beginning today.
